# Patient Record
Sex: Male | Born: 1975 | Race: Black or African American | Hispanic: No | Marital: Single | Smoking: Former smoker
Health system: Southern US, Community
[De-identification: ages and names within clinical notes are randomized; demographics above are authoritative.]

## PROBLEM LIST (undated history)

## (undated) DIAGNOSIS — R0789 Other chest pain: Secondary | ICD-10-CM

## (undated) DIAGNOSIS — I639 Cerebral infarction, unspecified: Secondary | ICD-10-CM

## (undated) DIAGNOSIS — E785 Hyperlipidemia, unspecified: Secondary | ICD-10-CM

## (undated) DIAGNOSIS — I10 Essential (primary) hypertension: Secondary | ICD-10-CM

## (undated) DIAGNOSIS — T884XXA Failed or difficult intubation, initial encounter: Secondary | ICD-10-CM

## (undated) DIAGNOSIS — E119 Type 2 diabetes mellitus without complications: Secondary | ICD-10-CM

## (undated) HISTORY — DX: Essential (primary) hypertension: I10

## (undated) HISTORY — DX: Other chest pain: R07.89

## (undated) HISTORY — DX: Morbid (severe) obesity due to excess calories: E66.01

## (undated) HISTORY — PX: HAND SURGERY: SHX662

---

## 2014-02-17 DIAGNOSIS — I10 Essential (primary) hypertension: Secondary | ICD-10-CM | POA: Insufficient documentation

## 2014-02-17 DIAGNOSIS — Z79899 Other long term (current) drug therapy: Secondary | ICD-10-CM | POA: Insufficient documentation

## 2014-12-14 DIAGNOSIS — R739 Hyperglycemia, unspecified: Secondary | ICD-10-CM | POA: Insufficient documentation

## 2015-08-31 ENCOUNTER — Observation Stay
Admission: EM | Admit: 2015-08-31 | Discharge: 2015-09-01 | Disposition: A | Discharge status: Home / Self Care | Payer: Self-pay | Attending: Internal Medicine | Admitting: Internal Medicine

## 2015-08-31 ENCOUNTER — Emergency Department: Payer: Self-pay

## 2015-08-31 ENCOUNTER — Emergency Department: Payer: MEDICAID

## 2015-08-31 ENCOUNTER — Encounter: Payer: Self-pay | Admitting: Emergency Medicine

## 2015-08-31 DIAGNOSIS — Z794 Long term (current) use of insulin: Secondary | ICD-10-CM | POA: Insufficient documentation

## 2015-08-31 DIAGNOSIS — N179 Acute kidney failure, unspecified: Secondary | ICD-10-CM | POA: Diagnosis present

## 2015-08-31 DIAGNOSIS — E1165 Type 2 diabetes mellitus with hyperglycemia: Secondary | ICD-10-CM | POA: Insufficient documentation

## 2015-08-31 DIAGNOSIS — N183 Chronic kidney disease, stage 3 (moderate): Secondary | ICD-10-CM | POA: Insufficient documentation

## 2015-08-31 DIAGNOSIS — E781 Pure hyperglyceridemia: Secondary | ICD-10-CM | POA: Insufficient documentation

## 2015-08-31 DIAGNOSIS — R0789 Other chest pain: Secondary | ICD-10-CM

## 2015-08-31 DIAGNOSIS — R079 Chest pain, unspecified: Secondary | ICD-10-CM | POA: Insufficient documentation

## 2015-08-31 DIAGNOSIS — IMO0002 Reserved for concepts with insufficient information to code with codable children: Secondary | ICD-10-CM

## 2015-08-31 DIAGNOSIS — F17200 Nicotine dependence, unspecified, uncomplicated: Secondary | ICD-10-CM | POA: Insufficient documentation

## 2015-08-31 DIAGNOSIS — E1122 Type 2 diabetes mellitus with diabetic chronic kidney disease: Principal | ICD-10-CM | POA: Insufficient documentation

## 2015-08-31 DIAGNOSIS — I129 Hypertensive chronic kidney disease with stage 1 through stage 4 chronic kidney disease, or unspecified chronic kidney disease: Secondary | ICD-10-CM | POA: Insufficient documentation

## 2015-08-31 DIAGNOSIS — E785 Hyperlipidemia, unspecified: Secondary | ICD-10-CM

## 2015-08-31 DIAGNOSIS — Z833 Family history of diabetes mellitus: Secondary | ICD-10-CM | POA: Insufficient documentation

## 2015-08-31 DIAGNOSIS — Z8249 Family history of ischemic heart disease and other diseases of the circulatory system: Secondary | ICD-10-CM | POA: Insufficient documentation

## 2015-08-31 HISTORY — DX: Type 2 diabetes mellitus without complications: E11.9

## 2015-08-31 HISTORY — DX: Hyperlipidemia, unspecified: E78.5

## 2015-08-31 LAB — GLUCOSE, CAPILLARY
Glucose-Capillary: 344 mg/dL — ABNORMAL HIGH (ref 65–99)
Glucose-Capillary: 386 mg/dL — ABNORMAL HIGH (ref 65–99)

## 2015-08-31 LAB — CBC
HEMATOCRIT: 43.5 % (ref 40.0–52.0)
Hemoglobin: 14.5 g/dL (ref 13.0–18.0)
MCH: 28 pg (ref 26.0–34.0)
MCHC: 33.2 g/dL (ref 32.0–36.0)
MCV: 84.4 fL (ref 80.0–100.0)
PLATELETS: 196 10*3/uL (ref 150–440)
RBC: 5.16 MIL/uL (ref 4.40–5.90)
RDW: 15.2 % — ABNORMAL HIGH (ref 11.5–14.5)
WBC: 5.6 10*3/uL (ref 3.8–10.6)

## 2015-08-31 LAB — URINALYSIS COMPLETE WITH MICROSCOPIC (ARMC ONLY)
BACTERIA UA: NONE SEEN
BILIRUBIN URINE: NEGATIVE
Glucose, UA: >500 mg/dL — AB
Ketones, ur: NEGATIVE mg/dL
LEUKOCYTES UA: NEGATIVE
NITRITE: NEGATIVE
PH: 5 (ref 5.0–8.0)
Protein, ur: 100 mg/dL — AB
Specific Gravity, Urine: 1.016 (ref 1.005–1.030)
Squamous Epithelial / LPF: NONE SEEN

## 2015-08-31 LAB — BASIC METABOLIC PANEL
ANION GAP: 8 (ref 5–15)
BUN: 21 mg/dL — ABNORMAL HIGH (ref 6–20)
CHLORIDE: 100 mmol/L — AB (ref 101–111)
CO2: 23 mmol/L (ref 22–32)
Calcium: 8.7 mg/dL — ABNORMAL LOW (ref 8.9–10.3)
Creatinine, Ser: 1.74 mg/dL — ABNORMAL HIGH (ref 0.61–1.24)
GFR calc non Af Amer: 47 mL/min — ABNORMAL LOW (ref >60)
GFR, EST AFRICAN AMERICAN: 55 mL/min — AB (ref >60)
Glucose, Bld: 475 mg/dL — ABNORMAL HIGH (ref 65–99)
Potassium: 4.3 mmol/L (ref 3.5–5.1)
SODIUM: 131 mmol/L — AB (ref 135–145)

## 2015-08-31 LAB — CREATININE, SERUM
CREATININE: 1.58 mg/dL — AB (ref 0.61–1.24)
GFR calc Af Amer: >60 mL/min (ref >60)
GFR calc non Af Amer: 53 mL/min — ABNORMAL LOW (ref >60)

## 2015-08-31 LAB — TROPONIN I
Troponin I: <0.03 ng/mL (ref <0.031)
Troponin I: <0.03 ng/mL (ref <0.031)

## 2015-08-31 MED ORDER — HEPARIN SODIUM (PORCINE) 5000 UNIT/ML IJ SOLN
5000.0000 [IU] | Freq: Three times a day (TID) | INTRAMUSCULAR | Status: DC
  Administered 2015-09-01 (×3): 5000 [IU] via SUBCUTANEOUS
  Filled 2015-08-31 (×3): qty 1

## 2015-08-31 MED ORDER — ASPIRIN 81 MG PO CHEW
324.0000 mg | CHEWABLE_TABLET | Freq: Once | ORAL | Status: AC
  Administered 2015-08-31: 324 mg via ORAL
  Filled 2015-08-31: qty 4

## 2015-08-31 MED ORDER — ACETAMINOPHEN 325 MG PO TABS: 650.0000 mg | ORAL_TABLET | Freq: Four times a day (QID) | ORAL | Status: DC | PRN

## 2015-08-31 MED ORDER — MORPHINE SULFATE (PF) 2 MG/ML IV SOLN: 2.0000 mg | INTRAVENOUS | Status: DC | PRN

## 2015-08-31 MED ORDER — SODIUM CHLORIDE 0.9 % IV SOLN
INTRAVENOUS | Status: DC
  Administered 2015-09-01 (×3): via INTRAVENOUS

## 2015-08-31 MED ORDER — INSULIN ASPART 100 UNIT/ML ~~LOC~~ SOLN
0.0000 [IU] | Freq: Every day | SUBCUTANEOUS | Status: DC
  Administered 2015-09-01: 5 [IU] via SUBCUTANEOUS
  Filled 2015-08-31: qty 5

## 2015-08-31 MED ORDER — INSULIN REGULAR HUMAN 100 UNIT/ML IJ SOLN: 6.0000 [IU] | Freq: Once | INTRAMUSCULAR | Status: DC

## 2015-08-31 MED ORDER — ONDANSETRON HCL 4 MG/2ML IJ SOLN: 4.0000 mg | Freq: Four times a day (QID) | INTRAMUSCULAR | Status: DC | PRN

## 2015-08-31 MED ORDER — DOCUSATE SODIUM 100 MG PO CAPS
100.0000 mg | ORAL_CAPSULE | Freq: Two times a day (BID) | ORAL | Status: DC
  Administered 2015-09-01 (×2): 100 mg via ORAL
  Filled 2015-08-31 (×2): qty 1

## 2015-08-31 MED ORDER — INSULIN ASPART 100 UNIT/ML ~~LOC~~ SOLN
6.0000 [IU] | Freq: Once | SUBCUTANEOUS | Status: AC
  Administered 2015-08-31: 6 [IU] via INTRAVENOUS

## 2015-08-31 MED ORDER — INSULIN ASPART 100 UNIT/ML ~~LOC~~ SOLN
0.0000 [IU] | Freq: Three times a day (TID) | SUBCUTANEOUS | Status: DC
  Administered 2015-09-01: 5 [IU] via SUBCUTANEOUS
  Administered 2015-09-01: 9 [IU] via SUBCUTANEOUS
  Administered 2015-09-01: 5 [IU] via SUBCUTANEOUS
  Filled 2015-08-31 (×2): qty 5
  Filled 2015-08-31: qty 9

## 2015-08-31 MED ORDER — ACETAMINOPHEN 650 MG RE SUPP: 650.0000 mg | Freq: Four times a day (QID) | RECTAL | Status: DC | PRN

## 2015-08-31 MED ORDER — SODIUM CHLORIDE 0.9 % IV BOLUS (SEPSIS)
1000.0000 mL | Freq: Once | INTRAVENOUS | Status: AC
  Administered 2015-08-31: 1000 mL via INTRAVENOUS

## 2015-08-31 MED ORDER — INSULIN ASPART 100 UNIT/ML ~~LOC~~ SOLN
6.0000 [IU] | Freq: Once | SUBCUTANEOUS | Status: DC
  Filled 2015-08-31: qty 6

## 2015-08-31 MED ORDER — ASPIRIN EC 81 MG PO TBEC
81.0000 mg | DELAYED_RELEASE_TABLET | Freq: Every day | ORAL | Status: DC
  Administered 2015-09-01: 81 mg via ORAL
  Filled 2015-08-31: qty 1

## 2015-08-31 MED ORDER — ONDANSETRON HCL 4 MG PO TABS: 4.0000 mg | ORAL_TABLET | Freq: Four times a day (QID) | ORAL | Status: DC | PRN

## 2015-08-31 NOTE — ED Provider Notes (Signed)
Time Seen: Approximately 1730  I have reviewed the triage notes  Chief Complaint: Chest Pain   History of Present Illness: Micheal Robinson is a 40 y.o. male who presents with a 2 day history of intermittent chest discomfort. Patient points mainly just left of the sternal border and denies any radiation of his pain. Patient admits that he's been off his diabetic medication for the last month. The patient denies any associated symptoms such as nausea, vomiting, shortness of breath or diaphoresis. He denies any radiation to the arm, jaw or back region. He states pain is worse whenever he pushes with his arms and moves. He denies any obvious pulmonary emboli risk factors outside of smoking.   Past Medical History  Diagnosis Date  . Diabetes mellitus without complication (HCC)   . Hypertension     There are no active problems to display for this patient.   No past surgical history on file.  No past surgical history on file.  No current outpatient prescriptions on file.  Allergies:  Review of patient's allergies indicates no known allergies.  Family History: No family history on file.  Social History: Social History  Substance Use Topics  . Smoking status: Current Every Day Smoker  . Smokeless tobacco: None  . Alcohol Use: No     Review of Systems:   10 point review of systems was performed and was otherwise negative:  Constitutional: No fever Eyes: No visual disturbances ENT: No sore throat, ear pain Cardiac: Left-sided sharp chest discomfort. This particular episode is been constant since 3 PM Respiratory: No shortness of breath, wheezing, or stridor Abdomen: No abdominal pain, no vomiting, No diarrhea Endocrine: No weight loss, No night sweats Extremities: No peripheral edema, cyanosis Skin: No rashes, easy bruising Neurologic: No focal weakness, trouble with speech or swollowing Urologic: No dysuria, HematuriaPatient does describe urinary  frequency   Physical Exam:  ED Triage Vitals  Enc Vitals Group     BP 08/31/15 1629 138/80 mmHg     Pulse Rate 08/31/15 1629 90     Resp 08/31/15 1629 16     Temp 08/31/15 1629 98 F (36.7 C)     Temp Source 08/31/15 1629 Oral     SpO2 08/31/15 1629 98 %     Weight 08/31/15 1629 270 lb (122.471 kg)     Height 08/31/15 1629 5\' 6"  (1.676 m)     Head Cir --      Peak Flow --      Pain Score 08/31/15 1629 8     Pain Loc --      Pain Edu? --      Excl. in GC? --     General: Awake , Alert , and Oriented times 3; GCS 15 Head: Normal cephalic , atraumatic Eyes: Pupils equal , round, reactive to light Nose/Throat: No nasal drainage, patent upper airway without erythema or exudate.  Neck: Supple, Full range of motion, No anterior adenopathy or palpable thyroid masses Lungs: Clear to ascultation without wheezes , rhonchi, or rales Heart: Regular rate, regular rhythm without murmurs , gallops , or rubs Abdomen: Soft, non tender without rebound, guarding , or rigidity; bowel sounds positive and symmetric in all 4 quadrants. No organomegaly .        Extremities: 2 plus symmetric pulses. No edema, clubbing or cyanosis Neurologic: normal ambulation, Motor symmetric without deficits, sensory intact Skin: warm, dry, no rashes Reproducible pain with palpation of the left sternal border without crepitus or step-off  noted.  Labs:   All laboratory work was reviewed including any pertinent negatives or positives listed below:  Labs Reviewed  BASIC METABOLIC PANEL - Abnormal; Notable for the following:    Sodium 131 (*)    Chloride 100 (*)    Glucose, Bld 475 (*)    BUN 21 (*)    Creatinine, Ser 1.74 (*)    Calcium 8.7 (*)    GFR calc non Af Amer 47 (*)    GFR calc Af Amer 55 (*)    All other components within normal limits  CBC - Abnormal; Notable for the following:    RDW 15.2 (*)    All other components within normal limits  TROPONIN I  URINALYSIS COMPLETEWITH MICROSCOPIC ([facility]  ONLY)  TROPONIN I  CREATININE, SERUM    EKG: * ED ECG REPORT I, Jennye Moccasin, the attending physician, personally viewed and interpreted this ECG.  Date: 08/31/2015 EKG Time: *1631 Rate: *89 Rhythm: normal sinus rhythm QRS Axis: normal Intervals: normal ST/T Wave abnormalities: normal Conduction Disturbances: none Narrative Interpretation: unremarkable Old anterior septal infarct with poor R-wave progression noticed in the anterior leads. No acute ischemic changes  Radiology:   DG Chest 2 View (Final result) Result time: 08/31/15 17:37:49   Final result by Rad Results In Interface (08/31/15 17:37:49)   Narrative:   CLINICAL DATA: Pt reports centralized chest pain since yesterday, reports soreness. Pt reports DM and hasn't been taking his medication x1 month. Hx HTN. Current smoker  EXAM: CHEST 2 VIEW  COMPARISON: None.  FINDINGS: The heart size and mediastinal contours are within normal limits. Both lungs are clear. No pleural effusion or pneumothorax. The visualized skeletal structures are unremarkable.  IMPRESSION: No active cardiopulmonary disease.   Electronically Signed By: Amie Portland M.D. On: 08/31/2015 17:37           I personally reviewed the radiologic studies     ED Course: * Patient's stay here showed some improvement though he still had an elevated blood sugar and renal insufficiency. Serial troponins were negative. I felt given his current clinical presentation and objective findings he likely required further observation with IV fluids until his renal function cracks. It does not appear that he is in DKA at this point though very dehydrated.    Assessment: Acute unspecified chest pain Renal insufficiency Hyperglycemia Noncompliance     Plan:  Inpatient management            Jennye Moccasin, MD 08/31/15 2322

## 2015-08-31 NOTE — ED Notes (Signed)
Pt reports centralized chest pain since yesterday, reports soreness. Pt reports DM and hasn't been taking his medication x1 month. Pt alert and oriented upon arrival, unable to obtain EKG in 10 minutes due to pt needing to use restroom.

## 2015-08-31 NOTE — ED Notes (Signed)
Pt transported to room 233. 

## 2015-09-01 ENCOUNTER — Encounter: Payer: Self-pay | Admitting: Internal Medicine

## 2015-09-01 ENCOUNTER — Telehealth: Payer: Self-pay | Admitting: Cardiology

## 2015-09-01 ENCOUNTER — Observation Stay (HOSPITAL_BASED_OUTPATIENT_CLINIC_OR_DEPARTMENT_OTHER)
Admit: 2015-09-01 | Discharge: 2015-09-01 | Disposition: A | Discharge status: Home / Self Care | Payer: MEDICAID | Attending: Student | Admitting: Student

## 2015-09-01 DIAGNOSIS — E1165 Type 2 diabetes mellitus with hyperglycemia: Secondary | ICD-10-CM

## 2015-09-01 DIAGNOSIS — R0789 Other chest pain: Secondary | ICD-10-CM

## 2015-09-01 DIAGNOSIS — R079 Chest pain, unspecified: Secondary | ICD-10-CM

## 2015-09-01 DIAGNOSIS — E785 Hyperlipidemia, unspecified: Secondary | ICD-10-CM

## 2015-09-01 DIAGNOSIS — R9431 Abnormal electrocardiogram [ECG] [EKG]: Secondary | ICD-10-CM

## 2015-09-01 DIAGNOSIS — IMO0002 Reserved for concepts with insufficient information to code with codable children: Secondary | ICD-10-CM

## 2015-09-01 DIAGNOSIS — E1122 Type 2 diabetes mellitus with diabetic chronic kidney disease: Principal | ICD-10-CM

## 2015-09-01 DIAGNOSIS — N182 Chronic kidney disease, stage 2 (mild): Secondary | ICD-10-CM

## 2015-09-01 DIAGNOSIS — N179 Acute kidney failure, unspecified: Secondary | ICD-10-CM

## 2015-09-01 LAB — TROPONIN I
Troponin I: <0.03 ng/mL (ref <0.031)
Troponin I: <0.03 ng/mL (ref <0.031)
Troponin I: <0.03 ng/mL (ref <0.031)

## 2015-09-01 LAB — LIPID PANEL
Cholesterol: 291 mg/dL — ABNORMAL HIGH (ref 0–200)
HDL: 39 mg/dL — ABNORMAL LOW (ref >40)
LDL Cholesterol: UNDETERMINED mg/dL (ref 0–99)
Total CHOL/HDL Ratio: 7.5 RATIO
Triglycerides: 982 mg/dL — ABNORMAL HIGH (ref <150)
VLDL: UNDETERMINED mg/dL (ref 0–40)

## 2015-09-01 LAB — TSH: TSH: 2.253 u[IU]/mL (ref 0.350–4.500)

## 2015-09-01 LAB — HEMOGLOBIN A1C: Hgb A1c MFr Bld: 13.4 % — ABNORMAL HIGH (ref 4.0–6.0)

## 2015-09-01 LAB — GLUCOSE, CAPILLARY
Glucose-Capillary: 289 mg/dL — ABNORMAL HIGH (ref 65–99)
Glucose-Capillary: 257 mg/dL — ABNORMAL HIGH (ref 65–99)
Glucose-Capillary: 395 mg/dL — ABNORMAL HIGH (ref 65–99)

## 2015-09-01 LAB — ECHOCARDIOGRAM COMPLETE
Height: 66 in
Weight: 4576 oz

## 2015-09-01 MED ORDER — INSULIN NPH ISOPHANE & REGULAR (70-30) 100 UNIT/ML ~~LOC~~ SUSP: 18.0000 [IU] | Freq: Two times a day (BID) | SUBCUTANEOUS | Status: DC

## 2015-09-01 MED ORDER — ASPIRIN 81 MG PO TBEC: 81.0000 mg | DELAYED_RELEASE_TABLET | Freq: Every day | ORAL | Status: DC

## 2015-09-01 MED ORDER — INSULIN STARTER KIT- SYRINGES (ENGLISH)
1.0000 | Freq: Once | Status: AC
  Administered 2015-09-01: 1
  Filled 2015-09-01: qty 1

## 2015-09-01 MED ORDER — LOVASTATIN 40 MG PO TABS: 40.0000 mg | ORAL_TABLET | Freq: Every day | ORAL | Status: AC

## 2015-09-01 MED ORDER — LIVING WELL WITH DIABETES BOOK
Freq: Once | Status: AC
  Administered 2015-09-01: 11:00:00
  Filled 2015-09-01: qty 1

## 2015-09-01 MED ORDER — INSULIN GLARGINE 100 UNIT/ML ~~LOC~~ SOLN
30.0000 [IU] | Freq: Every day | SUBCUTANEOUS | Status: DC
  Filled 2015-09-01: qty 0.3

## 2015-09-01 MED ORDER — INSULIN GLARGINE 100 UNIT/ML ~~LOC~~ SOLN
25.0000 [IU] | Freq: Every day | SUBCUTANEOUS | Status: DC
  Administered 2015-09-01: 25 [IU] via SUBCUTANEOUS
  Filled 2015-09-01 (×2): qty 0.25

## 2015-09-01 MED ORDER — PRAVASTATIN SODIUM 20 MG PO TABS
20.0000 mg | ORAL_TABLET | Freq: Every day | ORAL | Status: DC
  Administered 2015-09-01: 20 mg via ORAL
  Filled 2015-09-01: qty 1

## 2015-09-01 MED ORDER — PANTOPRAZOLE SODIUM 40 MG PO TBEC: 40.0000 mg | DELAYED_RELEASE_TABLET | Freq: Every day | ORAL | Status: DC

## 2015-09-01 MED ORDER — ATORVASTATIN CALCIUM 20 MG PO TABS: 80.0000 mg | ORAL_TABLET | Freq: Every day | ORAL | Status: DC

## 2015-09-01 NOTE — Consult Note (Signed)
Cardiology Consult    Patient ID: Micheal Robinson MRN: [id number], DOB/AGE: 12/09/1975   Admit date: 08/31/2015 Date of Consult: 09/01/2015  Primary Physician: No primary care provider on file. Reason for Consult: Chest Pain; Abnormal EKG Primary Cardiologist: New  Requesting Provider: Dr. Sheryle Hailiamond   History of Present Illness    Micheal HohLeonard R Lawlor is a 40 y.o. male with past medical history of HTN and Type 2 DM who [facility] for evaluation of chest pain.   Patient reports he developed a shooting pain radiating across his chest yesterday afternoon. The pain lasted for about 30 minutes then resolved spontaneously. Says his chest was tender to touch and remains tender to touch at this time. Says it feels sore, like he bench pressed too much weight (did not lift weights yesterday, but does this regularly). Was diaphoretic but denies any associated dyspnea, nausea, vomiting or radiating pain. No association with exertion.   Denies any history of chest discomfort in the past. No chest discomfort or dyspnea with exertion. Denies orthopnea, PND or lower extremity edema.   No family history of MI's or CAD. Says his mother and father both have Type 2 DM and HTN. Says he smokes 1-2 cigars weekly.   He has been diabetic for 10+ years but quit taking his diabetic and hypertensive medications 3-4 months ago due to running out of them. Was on Metformin and Lisinopril at that time.  While admitted, cyclic troponin values have been negative. CBC with WBC of 5.6, Hgb 14.5, and platelets 196. BMET with glucose of 475 and creatinine of 1.74 (unknown baseline).  A1c elevated to 13.4. Lipid Panel with Total Cholesterol of 291, Triglycerides 982, and HDL 39. LDL unable to be calculated due to calculated. CXR shows no active cardiopulmonary disease. EKG shows NSR, HR 89, nonspecific ST abnormality in Lead III, and poor R-wave progression. No prior tracings available for  comparison.   Past Medical History   Past Medical History  Diagnosis Date  . Diabetes mellitus without complication (HCC)   . Hypertension     Past Surgical History  Procedure Laterality Date  . Hand surgery       Allergies  No Known Allergies  Inpatient Medications    . aspirin EC  81 mg Oral Daily  . docusate sodium  100 mg Oral BID  . heparin  5,000 Units Subcutaneous Q8H  . insulin aspart  0-5 Units Subcutaneous QHS  . insulin aspart  0-9 Units Subcutaneous TID WC    Family History    Family History  Problem Relation Age of Onset  . CAD Father   . Diabetes Mellitus II Maternal Grandmother     Social History    Social History   Social History  . Marital Status: Married    Spouse Name: N/A  . Number of Children: N/A  . Years of Education: N/A   Occupational History  . Not on file.   Social History Main Topics  . Smoking status: Current Every Day Smoker  . Smokeless tobacco: Not on file  . Alcohol Use: No  . Drug Use: Not on file  . Sexual Activity: Not on file   Other Topics Concern  . Not on file   Social History Narrative     Review of Systems    General:  No chills, fever, night sweats or weight changes.  Cardiovascular:  No dyspnea on exertion, edema, orthopnea, palpitations, paroxysmal nocturnal dyspnea. Positive for chest pain.  Dermatological: No rash, lesions/masses Respiratory: No cough, dyspnea Urologic: No hematuria, dysuria Abdominal:   No nausea, vomiting, diarrhea, bright red blood per rectum, melena, or hematemesis Neurologic:  No visual changes, wkns, changes in mental status. All other systems reviewed and are otherwise negative except as noted above.  Physical Exam    Blood pressure 151/98, pulse 83, temperature 98.6 F (37 C), temperature source Oral, resp. rate 18, height  (1.676 m), weight 286 lb (129.729 kg), SpO2 94 %.  General: Pleasant, obese African American male appearing in NAD Psych: Normal  affect. Neuro: Alert and oriented X 3. Moves all extremities spontaneously. HEENT: Normal  Neck: Supple without bruits or JVD. Lungs:  Resp regular and unlabored, CTA without wheezing or rales. Heart: RRR no s3, s4, or murmurs. Tender to palpation along precordium.  Abdomen: Soft, non-tender, non-distended, BS + x 4.  Extremities: No clubbing, cyanosis or edema. DP/PT/Radials 2+ and equal bilaterally.  Labs    Troponin (Point of Care Test) No results for input(s): TROPIPOC in the last 72 hours.  Recent Labs  08/31/15 1638 08/31/15 2006 08/31/15 2327 09/01/15 0543  TROPONINI <0.03 <0.03 <0.03 <0.03   Lab Results  Component Value Date   WBC 5.6 08/31/2015   HGB 14.5 08/31/2015   HCT 43.5 08/31/2015   MCV 84.4 08/31/2015   PLT 196 08/31/2015    Recent Labs Lab 08/31/15 1638 08/31/15 2006  NA 131*  --   K 4.3  --   CL 100*  --   CO2 23  --   BUN 21*  --   CREATININE 1.74* 1.58*  CALCIUM 8.7*  --   GLUCOSE 475*  --    Lab Results  Component Value Date   CHOL 291* 09/01/2015   HDL 39* 09/01/2015   LDLCALC UNABLE TO CALCULATE IF TRIGLYCERIDE OVER 400 mg/dL 16/12/9602   TRIG 540* 09/01/2015   No results found for: [facility]   Radiology Studies    Dg Chest 2 View: 08/31/2015  CLINICAL DATA:  Pt reports centralized chest pain since yesterday, reports soreness. Pt reports DM and hasn't been taking his medication x1 month. Hx HTN. Current smoker EXAM: CHEST  2 VIEW COMPARISON:  None. FINDINGS: The heart size and mediastinal contours are within normal limits. Both lungs are clear. No pleural effusion or pneumothorax. The visualized skeletal structures are unremarkable. IMPRESSION: No active cardiopulmonary disease. Electronically Signed   By: Amie Portland M.D.   On: 08/31/2015 17:37    EKG & Cardiac Imaging    EKG: NSR, HR 89, nonspecific ST abnormality in Lead III, and poor R-wave progression (No prior tracings available for comparison.)  Echocardiogram: None on  file  Assessment & Plan    1. Atypical Chest Pain -  developed a shooting pain radiating across his chest yesterday afternoon, lasting for 30 minutes then resolving  spontaneously. Pain is reproducible with palpation. No exertional component noted. - cyclic troponin values have been negative. EKG shows nonspecific ST abnormality in Lead III and poor R-wave progression, unfortunately no prior tracings are available for comparison. - While his current presentation of chest pain sounds atypical for a cardiac etiology, he does have several cardiac risk factors including uncontrolled Type 2 DM, HTN, obesity, and current smoker (1-2 cigars weekly). - will obtain an echocardiogram for initial assessment of LV function and wall motion. If significantly abnormal, would require further ischemic evaluation while inpatient. Otherwise, if LV function normal, could consider an outpatient Treadmill Myoview. - continue ASA and statin.  2. HTN - BP has been 121/67 - 151/98 in the past 24 hours. - would restart Lisinopril once creatinine normalizes.   3. HLD - Lipid Panel with Total Cholesterol of 291, Triglycerides 982, and HDL 39. LDL unable to be calculated due to calculated.  - will start high-intensity statin. Check LFT's.   4. Uncontrolled Type 2 DM - A1c elevated to 13.4 this admission. - was on Metformin PTA but had not taken in 4+ months. - per admitting team.  5. AKI - unknown baseline. Creatinine elavted to 1.74 on admission, improved to 1.58 on most recent check.  - receiving IVF.  Signed, Ellsworth Lennox, PA-C 09/01/2015, 7:41 AM Pager: [phone]

## 2015-09-01 NOTE — Telephone Encounter (Signed)
TCM ph [facility] for chest pain saw harding needs 1 week fu from dc 09/01/15 Scheduled next available on 09/23/15 with Thayer Ohmhris Added to waitlist

## 2015-09-01 NOTE — Discharge Instructions (Addendum)
°  DIET:  Diabetic diet  DISCHARGE CONDITION:  Stable  ACTIVITY:  Activity as tolerated  OXYGEN:  Home Oxygen: No.   Oxygen Delivery: room air  DISCHARGE LOCATION:  home   If you experience worsening of your admission symptoms, develop shortness of breath, life threatening emergency, suicidal or homicidal thoughts you must seek medical attention immediately by calling 911 or calling your MD immediately  if symptoms less severe.  You Must read complete instructions/literature along with all the possible adverse reactions/side effects for all the Medicines you take and that have been prescribed to you. Take any new Medicines after you have completely understood and accpet all the possible adverse reactions/side effects.   Please note  You were cared for by a hospitalist during your hospital stay. If you have any questions about your discharge medications or the care you received while you were in the hospital after you are discharged, you can call the unit and asked to speak with the hospitalist on call if the hospitalist that took care of you is not available. Once you are discharged, your primary care physician will handle any further medical issues. Please note that NO REFILLS for any discharge medications will be authorized once you are discharged, as it is imperative that you return to your primary care physician (or establish a relationship with a primary care physician if you do not have one) for your aftercare needs so that they can reassess your need for medications and monitor your lab values.   Check blood sugars 3 times a day before meals

## 2015-09-01 NOTE — H&P (Signed)
Micheal Robinson is an 40 y.o. male.   Chief Complaint: Chest pain HPI: The patient with past medical history of diabetes and hypertension presents emergency department complaining of chest pain. The patient points to his upper chest just below his throat as the source of his pain. He states that it began hurting this afternoon while he was at work. He mentions that he's been under a lot of stress lately and that he also has run out of his medications. He denies shortness of breath, nausea, vomiting or diaphoresis. In the emergency department the patient was found to have poor R-wave progression in his precordial leads. Laboratory evaluation also revealed significant hyperglycemia as well as acute kidney injury which prompted the emergency department staff to call the hospitalist service for admission.  Past Medical History  Diagnosis Date  . Diabetes mellitus without complication ([place])   . Hypertension     Past Surgical History  Procedure Laterality Date  . Hand surgery      Family History  Problem Relation Age of Onset  . CAD Father   . Diabetes Mellitus II Maternal Grandmother    Social History:  reports that he has been smoking.  He does not have any smokeless tobacco history on file. He reports that he does not drink alcohol. His drug history is not on file.  Allergies: No Known Allergies  No prescriptions prior to admission  Metformin? Amlodipine? (Patient is not sure)  Results for orders placed or performed during the hospital encounter of 08/31/15 (from the past 48 hour(s))  Basic metabolic panel     Status: Abnormal   Collection Time: 08/31/15  4:38 PM  Result Value Ref Range   Sodium 131 (L) 135 - 145 mmol/L   Potassium 4.3 3.5 - 5.1 mmol/L   Chloride 100 (L) 101 - 111 mmol/L   CO2 23 22 - 32 mmol/L   Glucose, Bld 475 (H) 65 - 99 mg/dL   BUN 21 (H) 6 - 20 mg/dL   Creatinine, Ser 1.74 (H) 0.61 - 1.24 mg/dL   Calcium 8.7 (L) 8.9 - 10.3 mg/dL   GFR calc non Af Amer 47  (L) >60 mL/min   GFR calc Af Amer 55 (L) >60 mL/min    Comment: (NOTE) The eGFR has been calculated using the CKD EPI equation. This calculation has not been validated in all clinical situations. eGFR's persistently <60 mL/min signify possible Chronic Kidney Disease.    Anion gap 8 5 - 15  CBC     Status: Abnormal   Collection Time: 08/31/15  4:38 PM  Result Value Ref Range   WBC 5.6 3.8 - 10.6 K/uL   RBC 5.16 4.40 - 5.90 MIL/uL   Hemoglobin 14.5 13.0 - 18.0 g/dL   HCT 43.5 40.0 - 52.0 %   MCV 84.4 80.0 - 100.0 fL   MCH 28.0 26.0 - 34.0 pg   MCHC 33.2 32.0 - 36.0 g/dL   RDW 15.2 (H) 11.5 - 14.5 %   Platelets 196 150 - 440 K/uL  Troponin I     Status: None   Collection Time: 08/31/15  4:38 PM  Result Value Ref Range   Troponin I <0.03 <0.031 ng/mL    Comment:        NO INDICATION OF MYOCARDIAL INJURY.   Urinalysis complete, with microscopic ([facility] only)     Status: Abnormal   Collection Time: 08/31/15  5:58 PM  Result Value Ref Range   Color, Urine STRAW (A) YELLOW  APPearance CLEAR (A) CLEAR   Glucose, UA >500 (A) NEGATIVE mg/dL   Bilirubin Urine NEGATIVE NEGATIVE   Ketones, ur NEGATIVE NEGATIVE mg/dL   Specific Gravity, Urine 1.016 1.005 - 1.030   Hgb urine dipstick 1+ (A) NEGATIVE   pH 5.0 5.0 - 8.0   Protein, ur 100 (A) NEGATIVE mg/dL   Nitrite NEGATIVE NEGATIVE   Leukocytes, UA NEGATIVE NEGATIVE   RBC / HPF 0-5 0 - 5 RBC/hpf   WBC, UA 0-5 0 - 5 WBC/hpf   Bacteria, UA NONE SEEN NONE SEEN   Squamous Epithelial / LPF NONE SEEN NONE SEEN  Troponin I     Status: None   Collection Time: 08/31/15  8:06 PM  Result Value Ref Range   Troponin I <0.03 <0.031 ng/mL    Comment:        NO INDICATION OF MYOCARDIAL INJURY.   Creatinine, serum     Status: Abnormal   Collection Time: 08/31/15  8:06 PM  Result Value Ref Range   Creatinine, Ser 1.58 (H) 0.61 - 1.24 mg/dL   GFR calc non Af Amer 53 (L) >60 mL/min   GFR calc Af Amer >60 >60 mL/min    Comment: (NOTE) The  eGFR has been calculated using the CKD EPI equation. This calculation has not been validated in all clinical situations. eGFR's persistently <60 mL/min signify possible Chronic Kidney Disease.   Glucose, capillary     Status: Abnormal   Collection Time: 08/31/15  9:19 PM  Result Value Ref Range   Glucose-Capillary 344 (H) 65 - 99 mg/dL  TSH     Status: None   Collection Time: 08/31/15 11:27 PM  Result Value Ref Range   TSH 2.253 0.350 - 4.500 uIU/mL  Troponin I     Status: None   Collection Time: 08/31/15 11:27 PM  Result Value Ref Range   Troponin I <0.03 <0.031 ng/mL    Comment:        NO INDICATION OF MYOCARDIAL INJURY.   Glucose, capillary     Status: Abnormal   Collection Time: 08/31/15 11:30 PM  Result Value Ref Range   Glucose-Capillary 386 (H) 65 - 99 mg/dL   Dg Chest 2 View  08/31/2015  CLINICAL DATA:  Pt reports centralized chest pain since yesterday, reports soreness. Pt reports DM and hasn't been taking his medication x1 month. Hx HTN. Current smoker EXAM: CHEST  2 VIEW COMPARISON:  None. FINDINGS: The heart size and mediastinal contours are within normal limits. Both lungs are clear. No pleural effusion or pneumothorax. The visualized skeletal structures are unremarkable. IMPRESSION: No active cardiopulmonary disease. Electronically Signed   By: Lajean Manes M.D.   On: 08/31/2015 17:37    Review of Systems  Constitutional: Negative for fever and chills.  HENT: Negative for sore throat and tinnitus.   Eyes: Positive for blurred vision. Negative for redness.  Respiratory: Negative for cough and shortness of breath.   Cardiovascular: Positive for chest pain. Negative for palpitations, orthopnea and PND.  Gastrointestinal: Negative for nausea, vomiting, abdominal pain and diarrhea.  Genitourinary: Positive for frequency. Negative for dysuria and urgency.  Musculoskeletal: Negative for myalgias and joint pain.  Skin: Negative for rash.       No lesions  Neurological:  Negative for speech change, focal weakness and weakness.  Endo/Heme/Allergies: Does not bruise/bleed easily.       No temperature intolerance  Psychiatric/Behavioral: Negative for depression and suicidal ideas.    Blood pressure 146/94, pulse 85, temperature 98.8  F (37.1 C), temperature source Oral, resp. rate 18, height '5\' 6"'$  (1.676 m), weight 129.729 kg (286 lb), SpO2 99 %. Physical Exam  Vitals reviewed. Constitutional: He is oriented to person, place, and time. He appears well-developed and well-nourished. No distress.  HENT:  Head: Normocephalic and atraumatic.  Mouth/Throat: Oropharynx is clear and moist.  Eyes: Conjunctivae and EOM are normal. Pupils are equal, round, and reactive to light. No scleral icterus.  Neck: Normal range of motion. Neck supple. No JVD present. No tracheal deviation present. No thyromegaly present.  Cardiovascular: Normal rate and regular rhythm.  Exam reveals no gallop and no friction rub.   No murmur heard. Respiratory: Effort normal and breath sounds normal. No respiratory distress. He exhibits tenderness (reproducible).  GI: Soft. Bowel sounds are normal. He exhibits no distension. There is no tenderness.  Genitourinary:  Deferred  Musculoskeletal: Normal range of motion. He exhibits no edema.  Lymphadenopathy:    He has no cervical adenopathy.  Neurological: He is alert and oriented to person, place, and time. No cranial nerve deficit.  Skin: Skin is warm and dry. No rash noted. No erythema.  Psychiatric: He has a normal mood and affect. His behavior is normal. Judgment and thought content normal.     Assessment/Plan This is a 40 year old male admitted for chest pain and hyperglycemia. 1. Chest pain: Reproducible and unlikely cardiac in origin. However it is clear the patient has had some strain to his heart muscle and/or silent heart attack in the past as he has downgoing R waves in his precordial leads. Troponin has been negative so far.  Continue to monitor telemetry. 2. IVs mellitus type II: With hyperglycemia. The patient cannot remember specifically which oral hypoglycemic was taking. While hospitalized I will start sliding scale insulin. He mentions that in the morning he frequently has blurry vision and that he has to urinate very frequently throughout the night. He will likely benefit from Lantus at night. 3. Essential hypertension: Controlled for now. If his acute kidney injury resolves he'll benefit from a low-dose ACE inhibitor. 4. Acute kidney injury: Aggressively hydrate with intravenous saline. 5. DVT prophylaxis: Heparin 6. GI prophylaxis: None The patient is a full code. Time spent on admission orders and patient care approximately 45 minutes  Harrie Foreman, MD 09/01/2015, 1:51 AM

## 2015-09-01 NOTE — Care Management (Signed)
Glucometer given to patient. Faxed medication management application to clinic for patient.

## 2015-09-01 NOTE — Progress Notes (Signed)
Spoke with patient and his wife about diabetes and home regimen for diabetes control. Patient reports that he does not have a PCP and has not taken any medications for diabetes in 3-4 months when he ran out of medications. Patient states that he was taking oral medication for diabetes in the past. Patient has been having symptoms of hyperglycemia for a couple of months. Patient has not checked his glucose at home because he lost his glucometer when he last moved. Discussed A1C results (13.4% on 08/31/15) and explained that his current A1C indicates an average glucose of 338 mg/dl over the past 2-3 months. Discussed glucose and A1C goals. Discussed hyperglycemia and hypoglycemia along with treatment for both. Explained to the patient that his glucose will not be perfect before he is discharged and explained that he will need to take insulin as prescribed and keep a record which the doctor he follows up with will use to make further adjustments with his insulin. Discussed importance of checking CBGs and maintaining good CBG control to prevent long-term and short-term complications.  Stressed to the patient the importance of improving glycemic control to prevent further complications from uncontrolled diabetes. Discussed impact of nutrition, exercise, stress, sickness, and medications on diabetes control.Patient reports that he does not have any insurance and plans to get established with [facility] and will [facility] to get discharge medications.  Discussed Lantus and Novolog insulin which patient is receiving as an inpatient. Patient inquired about just taking a pill for diabetes; but explained that with his A1C being 13.4% he is going to need insulin. However, informed patient that the doctor may also use oral DM medications in addition to the insulin if appropriate. Explained that Lantus is about $200 per vial and Novolog is about $150 per vial. Explained to the patient that diabetes  coordinator will make a recommendation to discharge him on a more affordable insulin such as NOVOLIN 70/30. Informed patient that Novolin 70/30 [facility] $25 per vial. Provided patient with handout information on Reli-On products and encouraged patient to go to [facility] to get the Reli-On Prime glucometer for $9 and a box of 50 Reli-On test strips for $9. Discussed 70/30 insulin in detail (how to take it, when to take it). Asked patient to check his glucose 3-4 times per day (before meals and at bedtime) and to keep a log book of glucose readings and insulin taken. Explained how the doctor he follows up with can use the log book to continue to make insulin adjustments if needed. Reviewed and demonstrated how to draw up and administer insulin with vial and syringe. Patient was able to successfully demonstrate how to draw up and administer insulin with vial and syringe. Informed patient that RN will be asking him to self-administer insulin to ensure proper technique and ability to administer self insulin shots.   Encouraged patient to go ahead and call the [facility] to get an appointment and to ask if they have any glucometers for patients. Also encouraged patient to go ahead and fill out application for [facility]. Patient verbalized understanding of information discussed and he states that he has no further questions at this time related to diabetes.   RNs to provide ongoing basic DM education at bedside with this patient and engage patient to actively check blood glucose and administer insulin injections.   Patient was given Lantus 25 units this morning at 9:37 am today and glucose  257 at 11:25 am. Anticipate patient will require more basal insulin than currently ordered and would benefit from meal coverage insulin as well. Recommend discharging patient on 70/30 23 units BID with meals (based on 129 kg x 0.25 units; which will provide a total of 32 units for basal  insulin and 14 units for meal coverage per day).   MD - At time of discharge, please provide patient with Rx for NOVOLIN 70/30.   Thanks, Orlando Penner, RN, MSN, CDE Diabetes Coordinator Inpatient Diabetes Program [phone] (Team Pager from 8am to 5pm) [phone] ([facility] office) [phone] [facility] office) [phone] [facility] office)

## 2015-09-01 NOTE — Care Management (Signed)
Attempted to see patient. He was resting with his eyes closed and would not wake to his name being called. Will reassess.

## 2015-09-01 NOTE — Progress Notes (Signed)
Patient is discharge home in a stable condition, summary and f/u care given ,verbalized understanding , left with wife  

## 2015-09-01 NOTE — Progress Notes (Signed)
[facility]         [place], [place]   09/01/2015  Patient: Micheal Robinson   Date of Birth:  02/21/1976  Date of admission:  08/31/2015  Date of Discharge  09/01/2015    To Whom it May Concern:   Micheal Robinson  may return to work on 09/02/2015.  If you have any questions or concerns, please don't hesitate to call.  Sincerely,   [name], Izekiel Flegel R M.D Office : [phone]   .

## 2015-09-01 NOTE — Care Management (Signed)
Attempted assessment again. Patient had visitors at bedside. He ask that I come back at another. Will reassess.

## 2015-09-01 NOTE — Care Management (Addendum)
Met with patient at bedside to discuss discharge planning. Patient lives at home with his girlfriend. He is independent, active and works 2 jobs. Denies issues accessing transportation. Uninsured. [facility]. Email sent to [facility] for follow up. New diabetic.Marland KitchenMarland KitchenPatient denies issues getting a glucometer and paying for insulin if needed. Explained to him that Novolin at [facility] was 24.88/bottle. Diabetic Coordinator to see patient prior to DC.

## 2015-09-01 NOTE — Progress Notes (Signed)
*  PRELIMINARY RESULTS* Echocardiogram 2D Echocardiogram has been performed.  Micheal HousekeeperJerry R Robinson 09/01/2015, 10:34 AM

## 2015-09-01 NOTE — Telephone Encounter (Signed)
Patient [facility] 14.  Patient understands to follow up with provider Ward Givenshris Berge on 09/23/15 at 11:00 [facility]. Patient understands discharge instructions? yes Patient understands medications and regiment? yes Patient understands to bring all medications to this visit? yes

## 2015-09-04 NOTE — Discharge Summary (Signed)
[facility] [place] [facility]   PATIENT NAME: Micheal Robinson    MR#:  [id number]  DATE OF BIRTH:  Aug 22, 1975  DATE OF ADMISSION:  08/31/2015 ADMITTING PHYSICIAN: Arnaldo Natal, MD  DATE OF DISCHARGE: 09/01/2015  5:35 PM  PRIMARY CARE PHYSICIAN: No primary care provider on file.   ADMISSION DIAGNOSIS:  Chest pain, unspecified chest pain type [R07.9]  DISCHARGE DIAGNOSIS:  Active Problems:   AKI (acute kidney injury) (HCC)   Atypical chest pain   Dyslipidemia   Uncontrolled type 2 diabetes mellitus (HCC)   Pain in the chest   SECONDARY DIAGNOSIS:   Past Medical History  Diagnosis Date  . Diabetes mellitus without complication (HCC)     a. 08/2015: uncontrolled, A1c 13.4  . Hypertension   . Hyperlipidemia      ADMITTING HISTORY  Micheal Robinson is an 40 y.o. Male.  Chief Complaint: Chest pain  HPI: The patient with past medical history of diabetes and hypertension presents emergency department complaining of chest pain. The patient points to his upper chest just below his throat as the source of his pain. He states that it began hurting this afternoon while he was at work. He mentions that he's been under a lot of stress lately and that he also has run out of his medications. He denies shortness of breath, nausea, vomiting or diaphoresis. In the emergency department the patient was found to have poor R-wave progression in his precordial leads. Laboratory evaluation also revealed significant hyperglycemia as well as acute kidney injury which prompted the emergency department staff to call the hospitalist service for admission.  HOSPITAL COURSE:   * Patient had some neck pain with nonspecific EKG changes. Upon inject 3 times was normal. Echocardiogram showed normal ejection fraction without wall motion abnormalities. Cardiology has seen the patient and advised outpatient follow-up for a routine stress test. His pain was thought to be  likely symptoms of GERD. Have resolved.  * Uncontrolled diabetes mellitus Patient was on metformin in the past but has not taken it for 4 months. His HbA1c is significantly elevated at 13.7. Started on insulin 70/30 mix. Blood sugars improved.  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 Follow-up as outpatient.  * Hypertriglyceridemia Due to uncontrolled diabetes. Started on statin and gemfibrozil. Repeat labs of lipid panel, CK and LFTs in 4 weeks with PCP.   Discharged home in stable condition to follow-up with his PCP and cardiology.  CONSULTS OBTAINED:  Treatment Team:  Antonieta Iba, MD  DRUG ALLERGIES:  No Known Allergies  DISCHARGE MEDICATIONS:   Discharge Medication List as of 09/01/2015  4:02 PM    START taking these medications   Details  aspirin EC 81 MG EC tablet Take 1 tablet (81 mg total) by mouth daily., Starting 09/01/2015, Until Discontinued, OTC    insulin NPH-regular Human (NOVOLIN 70/30) (70-30) 100 UNIT/ML injection Inject 18 Units into the skin 2 (two) times daily with a meal., Starting 09/01/2015, Until Discontinued, Normal    lovastatin (MEVACOR) 40 MG tablet Take 1 tablet (40 mg total) by mouth at bedtime., Starting 09/01/2015, Until Discontinued, Normal    pantoprazole (PROTONIX) 40 MG tablet Take 1 tablet (40 mg total) by mouth daily., Starting 09/01/2015, Until Discontinued, Normal        Today   VITAL SIGNS:  Blood pressure 130/65, pulse 77, temperature 98 F (36.7 C), temperature source Oral, resp. rate 20, height  (1.676 m), weight 129.729 kg (  286 lb), SpO2 95 %.  I/O:  No intake or output data in the 24 hours ending 09/04/15 1231  PHYSICAL EXAMINATION:  Physical Exam  GENERAL:  40 y.o.-year-old patient lying in the bed with no acute distress. Morbidly obese LUNGS: Normal breath sounds bilaterally, no wheezing, rales,rhonchi or crepitation. No use of accessory muscles of respiration.  CARDIOVASCULAR: S1, S2 normal. No  murmurs, rubs, or gallops.  ABDOMEN: Soft, non-tender, non-distended. Bowel sounds present. No organomegaly or mass.  NEUROLOGIC: Moves all 4 extremities. PSYCHIATRIC: The patient is alert and oriented x 3.  SKIN: No obvious rash, lesion, or ulcer.   DATA REVIEW:   CBC  Recent Labs Lab 08/31/15 1638  WBC 5.6  HGB 14.5  HCT 43.5  PLT 196    Chemistries   Recent Labs Lab 08/31/15 1638 08/31/15 2006  NA 131*  --   K 4.3  --   CL 100*  --   CO2 23  --   GLUCOSE 475*  --   BUN 21*  --   CREATININE 1.74* 1.58*  CALCIUM 8.7*  --     Cardiac Enzymes  Recent Labs Lab 09/01/15 1116  TROPONINI <0.03    Microbiology Results  No results found for this or any previous visit.  RADIOLOGY:  No results found.  Follow up with PCP in 1 week.  Management plans discussed with the patient, family and they are in agreement.  CODE STATUS:  Code Status History    Date Active Date Inactive Code Status Order ID Comments User Context   08/31/2015 11:23 PM 09/01/2015  8:35 PM Full Code [id number]  Arnaldo NatalMichael S Diamond, MD Inpatient      TOTAL TIME TAKING CARE OF THIS PATIENT ON DAY OF DISCHARGE: more than 30 minutes.   [name], Madden Piazza R M.D on 09/04/2015 at 12:31 PM  Between 7am to 6pm - Pager - [phone]  After 6pm go to [email or web address] - password [email or web address] [facility]  [facility]  Office  [phone]  CC: Primary care physician; No primary care provider on file.  Note: This dictation was prepared with Dragon dictation along with smaller phrase technology. Any transcriptional errors that result from this process are unintentional.

## 2015-09-19 ENCOUNTER — Emergency Department
Admission: EM | Admit: 2015-09-19 | Discharge: 2015-09-19 | Disposition: A | Discharge status: Home / Self Care | Payer: Self-pay | Attending: Emergency Medicine | Admitting: Emergency Medicine

## 2015-09-19 ENCOUNTER — Emergency Department: Payer: Self-pay

## 2015-09-19 ENCOUNTER — Encounter: Payer: Self-pay | Admitting: Emergency Medicine

## 2015-09-19 DIAGNOSIS — F17210 Nicotine dependence, cigarettes, uncomplicated: Secondary | ICD-10-CM | POA: Insufficient documentation

## 2015-09-19 DIAGNOSIS — M2550 Pain in unspecified joint: Secondary | ICD-10-CM

## 2015-09-19 DIAGNOSIS — M25541 Pain in joints of right hand: Secondary | ICD-10-CM | POA: Insufficient documentation

## 2015-09-19 DIAGNOSIS — M25542 Pain in joints of left hand: Secondary | ICD-10-CM | POA: Insufficient documentation

## 2015-09-19 DIAGNOSIS — I10 Essential (primary) hypertension: Secondary | ICD-10-CM | POA: Insufficient documentation

## 2015-09-19 DIAGNOSIS — Z794 Long term (current) use of insulin: Secondary | ICD-10-CM | POA: Insufficient documentation

## 2015-09-19 DIAGNOSIS — Z7982 Long term (current) use of aspirin: Secondary | ICD-10-CM | POA: Insufficient documentation

## 2015-09-19 DIAGNOSIS — E119 Type 2 diabetes mellitus without complications: Secondary | ICD-10-CM | POA: Insufficient documentation

## 2015-09-19 DIAGNOSIS — E785 Hyperlipidemia, unspecified: Secondary | ICD-10-CM | POA: Insufficient documentation

## 2015-09-19 MED ORDER — TRAMADOL HCL 50 MG PO TABS: 50.0000 mg | ORAL_TABLET | Freq: Four times a day (QID) | ORAL | Status: DC | PRN

## 2015-09-19 MED ORDER — NAPROXEN 500 MG PO TABS
500.0000 mg | ORAL_TABLET | Freq: Once | ORAL | Status: AC
  Administered 2015-09-19: 500 mg via ORAL
  Filled 2015-09-19: qty 1

## 2015-09-19 MED ORDER — MELOXICAM 15 MG PO TABS: 15.0000 mg | ORAL_TABLET | Freq: Every day | ORAL | Status: DC

## 2015-09-19 NOTE — ED Provider Notes (Signed)
[facility] Emergency Department Provider Note ____________________________________________  Time seen: Approximately 9:48 PM  I have reviewed the triage vital signs and the nursing notes.   HISTORY  Chief Complaint Hand Pain    HPI Micheal Robinson is a 40 y.o. male who presents to emergency department for evaluation of bilateral hand pain. He states that he fell couple of days ago and landed on his outstretched hands and now has pain in all the joints of his fingers and wrists. He states that the pain has been progressively worsening each day since then. He has had no relief with ibuprofen or Tylenol. He states that the swelling in his hands had gone down last night, but today he cooked on the grill for a family reunion and the swelling has returned and the pain has increased.  Past Medical History  Diagnosis Date  . Diabetes mellitus without complication (HCC)     a. 08/2015: uncontrolled, A1c 13.4  . Hypertension   . Hyperlipidemia     Patient Active Problem List   Diagnosis Date Noted  . Atypical chest pain 09/01/2015  . Dyslipidemia 09/01/2015  . Uncontrolled type 2 diabetes mellitus (HCC) 09/01/2015  . Pain in the chest   . AKI (acute kidney injury) (HCC) 08/31/2015    Past Surgical History  Procedure Laterality Date  . Hand surgery      Current Outpatient Rx  Name  Route  Sig  Dispense  Refill  . aspirin EC 81 MG EC tablet   Oral   Take 1 tablet (81 mg total) by mouth daily.         . insulin NPH-regular Human (NOVOLIN 70/30) (70-30) 100 UNIT/ML injection   Subcutaneous   Inject 18 Units into the skin 2 (two) times daily with a meal.   10 mL   2   . lovastatin (MEVACOR) 40 MG tablet   Oral   Take 1 tablet (40 mg total) by mouth at bedtime.   30 tablet   0   . meloxicam (MOBIC) 15 MG tablet   Oral   Take 1 tablet (15 mg total) by mouth daily.   30 tablet   0   . pantoprazole (PROTONIX) 40 MG tablet   Oral   Take 1  tablet (40 mg total) by mouth daily.   15 tablet   0   . traMADol (ULTRAM) 50 MG tablet   Oral   Take 1 tablet (50 mg total) by mouth every 6 (six) hours as needed.   12 tablet   0     Allergies Review of patient's allergies indicates no known allergies.  Family History  Problem Relation Age of Onset  . Diabetes Mellitus II Maternal Grandmother   . Diabetes Mellitus II Mother   . Diabetes Mellitus II Father   . Hypertension Mother   . Hypertension Father     Social History Social History  Substance Use Topics  . Smoking status: Current Some Day Smoker    Types: Cigars  . Smokeless tobacco: None  . Alcohol Use: No    Review of Systems Constitutional: No recent illness. Cardiovascular: Denies chest pain or palpitations. Respiratory: Denies shortness of breath. Musculoskeletal: Pain in Bilateral hands Skin: Negative for rash, wound, lesion. Neurological: Negative for focal weakness or numbness.  ____________________________________________   PHYSICAL EXAM:  VITAL SIGNS: Temperature 98.2, blood pressure 138/83, respiratory rate 18, SPO2 95% on room air. ED Triage Vitals  Enc Vitals Group     BP --  Pulse --      Resp --      Temp --      Temp src --      SpO2 --      Weight --      Height --      Head Cir --      Peak Flow --      Pain Score 09/19/15 2134 10     Pain Loc --      Pain Edu? --      Excl. in GC? --     Constitutional: Alert and oriented. Well appearing and in no acute distress. Eyes: Conjunctivae are normal. EOMI. Head: Atraumatic. Neck: No stridor.  Respiratory: Normal respiratory effort.   Musculoskeletal: Full range of motion of the right hand with minimal swelling. Limited flexion of all fingers of the left hand due to pain. There is equal amount of swelling of both hands. There is full range of motion of bilateral wrists. Neurologic:  Normal speech and language. No gross focal neurologic deficits are appreciated. Speech is  normal. No gait instability. Skin:  Skin is warm, dry and intact. Atraumatic. Psychiatric: Mood and affect are normal. Speech and behavior are normal.  ____________________________________________   LABS (all labs ordered are listed, but only abnormal results are displayed)  Labs Reviewed - No data to display ____________________________________________  RADIOLOGY  Bilateral hand films are negative for acute bony abnormality per radiology. ____________________________________________   PROCEDURES  Procedure(s) performed:  None   ____________________________________________   INITIAL IMPRESSION / ASSESSMENT AND PLAN / ED COURSE  Pertinent labs & imaging results that were available during my care of the patient were reviewed by me and considered in my medical decision making (see chart for details).  Patient was prescribed tramadol and meloxicam to be taken for pain. He was advised to follow-up with his primary care provider for symptoms that are not improving with medications. He was instructed to return to the emergency department for symptoms that change or worsen if unable to schedule an appointment. ____________________________________________   FINAL CLINICAL IMPRESSION(S) / ED DIAGNOSES  Final diagnoses:  Polyarthralgia       Chinita PesterCari B Sheretha Shadd, FNP 09/19/15 2245  Sharyn CreamerMark Quale, MD 09/19/15 2353

## 2015-09-19 NOTE — Discharge Instructions (Signed)

## 2015-09-19 NOTE — ED Notes (Signed)
Micheal Robinson and caught himself 3 days ago - was cooking out today and noticed increasingly painful while cooking

## 2015-09-19 NOTE — ED Notes (Signed)
Patient complains of hand pain that started "a couple of days ago" and he has tried using ice and ibuprofen for pain relief but hs got none.  His wife has pulled on his finger and they heard some "pops" and the patient said he had a little relief then.  He feels like there is pressure in his hands.  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

## 2015-09-20 ENCOUNTER — Ambulatory Visit: Payer: Self-pay

## 2015-09-23 ENCOUNTER — Encounter: Payer: Self-pay | Admitting: Nurse Practitioner

## 2015-09-23 ENCOUNTER — Ambulatory Visit (INDEPENDENT_AMBULATORY_CARE_PROVIDER_SITE_OTHER): Payer: Self-pay | Admitting: Nurse Practitioner

## 2015-09-23 VITALS — BP 140/92 | HR 81 | Ht 66.0 in | Wt 293.8 lb

## 2015-09-23 DIAGNOSIS — N182 Chronic kidney disease, stage 2 (mild): Secondary | ICD-10-CM

## 2015-09-23 DIAGNOSIS — I10 Essential (primary) hypertension: Secondary | ICD-10-CM

## 2015-09-23 DIAGNOSIS — E119 Type 2 diabetes mellitus without complications: Secondary | ICD-10-CM

## 2015-09-23 DIAGNOSIS — R079 Chest pain, unspecified: Secondary | ICD-10-CM

## 2015-09-23 NOTE — Patient Instructions (Addendum)
Medication Instructions:  Please continue your current medications  Labwork: None  Testing/Procedures: [facility]  Your caregiver has ordered a Stress Test with nuclear imaging. The purpose of this test is to evaluate the blood supply to your heart muscle. This procedure is referred to as a "Non-Invasive Stress Test." This is because other than having an IV started in your vein, nothing is inserted or "invades" your body. Cardiac stress tests are done to find areas of poor blood flow to the heart by determining the extent of coronary artery disease (CAD). Some patients exercise on a treadmill, which naturally increases the blood flow to your heart, while others who are  unable to walk on a treadmill due to physical limitations have a pharmacologic/chemical stress agent called Lexiscan . This medicine will mimic walking on a treadmill by temporarily increasing your coronary blood flow.   Please note: these test may take anywhere between 2-4 hours to complete  PLEASE REPORT TO [facility]  THE VOLUNTEERS AT THE FIRST DESK WILL DIRECT YOU WHERE TO GO  Date of Procedure:______Monday, July 17 & Tuesday, July 18____  Arrival Time for Procedure:______7:15 am_______   Do not eat or drink after midnight  No caffeine for 24 hours prior to test  No smoking 24 hours prior to test.  Your medication may be taken with water.  If your doctor stopped a medication because of this test, do not take that medication.  Ladies, please do not wear dresses.  Skirts or pants are appropriate. Please wear a short sleeve shirt.  No perfume, cologne or lotion.  Wear comfortable walking shoes. No heels!  Follow-Up: As needed pending results of your stress test  If you need a refill on your cardiac medications before your next appointment, please call your pharmacy.  Cardiac Nuclear Scanning A cardiac nuclear scan is used to check your heart for problems, such as the following:  A portion  of the heart is not getting enough blood.  Part of the heart muscle has died, which happens with a heart attack.  The heart wall is not working normally.  In this test, a radioactive dye (tracer) is injected into your bloodstream. After the tracer has traveled to your heart, a scanning device is used to measure how much of the tracer is absorbed by or distributed to various areas of your heart. LET [facility] KNOW ABOUT:  Any allergies you have.  All medicines you are taking, including vitamins, herbs, eye drops, creams, and over-the-counter medicines.  Previous problems you or members of your family have had with the use of anesthetics.  Any blood disorders you have.  Previous surgeries you have had.  Medical conditions you have.  RISKS AND COMPLICATIONS Generally, this is a safe procedure. However, as with any procedure, problems can occur. Possible problems include:   Serious chest pain.  Rapid heartbeat.  Sensation of warmth in your chest. This usually passes quickly. BEFORE THE PROCEDURE Ask your health care provider about changing or stopping your regular medicines. PROCEDURE This procedure is usually done at a hospital and takes 2-4 hours.  An IV tube is inserted into one of your veins.  Your health care provider will inject a small amount of radioactive tracer through the tube.  You will then wait for 20-40 minutes while the tracer travels through your bloodstream.  You will lie down on an exam table so images of your heart can be taken. Images will be taken for about 15-20  minutes.  You will exercise on a treadmill or stationary bike. While you exercise, your heart activity will be monitored with an electrocardiogram (ECG), and your blood pressure will be checked.  If you are unable to exercise, you may be given a medicine to make your heart beat faster.  When blood flow to your heart has peaked, tracer will again be injected through the IV  tube.  After 20-40 minutes, you will get back on the exam table and have more images taken of your heart.  When the procedure is over, your IV tube will be removed. AFTER THE PROCEDURE  You will likely be able to leave shortly after the test. Unless your health care provider tells you otherwise, you may return to your normal schedule, including diet, activities, and medicines.  Make sure you find out how and when you will get your test results.   This information is not intended to replace advice given to you by your health care provider. Make sure you discuss any questions you have with your health care provider.   Document Released: 03/31/2004 Document Revised: 03/11/2013 Document Reviewed: 02/12/2013 Elsevier Interactive Patient Education [employer]

## 2015-09-23 NOTE — Progress Notes (Signed)
Office Visit    Patient Name: Micheal FerraraLeonard R Tewksbury Jr. Date of Encounter: 09/23/2015  Primary Care Provider:  Pcp Not In System Primary Cardiologist:  Seen by D. Herbie BaltimoreHarding, MD @ [facility] 08/2015.  Chief Complaint    40 y/o ? with a h/o HTN, obesity, and DM, who presents for f/u after recent admission for chest pain.  Past Medical History    Past Medical History  Diagnosis Date  . Diabetes mellitus without complication (HCC)     a. 08/2015: uncontrolled, A1c 13.4  . Essential hypertension   . Hyperlipidemia   . Atypical chest pain     a. 08/2015, neg troponin;  b. 08/2015 Echo: EF 50-55%, no rwma, nl RV fxn.  . Morbid obesity [facility](HCC)    Past Surgical History  Procedure Laterality Date  . Hand surgery      Allergies  No Known Allergies  History of Present Illness    40 y/o ? with a h/o HTN, DM, and obesity. [facility] with atypical, musculoskeletal c/p, and ruled out for MI.  Echo showed nl LV fxn.  With h/o HTN and DM, outpt Myoview was recommended.  Since d/c, he has not had any further chest pain.  He is fairly active w/o limitations.  He denies palpitations, dyspnea, pnd, orthopnea, n, v, dizziness, syncope, edema, weight gain, or early satiety.   Home Medications    Prior to Admission medications   Medication Sig Start Date End Date Taking? Authorizing Provider  aspirin EC 81 MG EC tablet Take 1 tablet (81 mg total) by mouth daily. 09/01/15  Yes Srikar Sudini, MD  insulin NPH-regular Human (NOVOLIN 70/30) (70-30) 100 UNIT/ML injection Inject 18 Units into the skin 2 (two) times daily with a meal. 09/01/15  Yes Srikar Sudini, MD  lovastatin (MEVACOR) 40 MG tablet Take 1 tablet (40 mg total) by mouth at bedtime. 09/01/15  Yes Srikar Sudini, MD  meloxicam (MOBIC) 15 MG tablet Take 1 tablet (15 mg total) by mouth daily. 09/19/15  Yes [name] Triplett, FNP  pantoprazole (PROTONIX) 40 MG tablet Take 1 tablet (40 mg total) by mouth daily. 09/01/15  Yes Srikar Sudini,  MD  traMADol (ULTRAM) 50 MG tablet Take 1 tablet (50 mg total) by mouth every 6 (six) hours as needed. 09/19/15  Yes Chinita Pesterari B Triplett, FNP    Review of Systems    He denies chest pain, palpitations, dyspnea, pnd, orthopnea, n, v, dizziness, syncope, edema, weight gain, or early satiety.  All other systems reviewed and are otherwise negative except as noted above.  Physical Exam    VS:  BP 140/92 mmHg  Pulse 81  Ht 5\' 6"  (1.676 m)  Wt 293 lb 12.8 oz (133.267 kg)  BMI 47.44 kg/m2 , BMI Body mass index is 47.44 kg/(m^2).   Repeat BP: 136/90  GEN: Well nourished, well developed, in no acute distress. HEENT: normal. Neck: Supple, no JVD, carotid bruits, or masses. Cardiac: RRR, no murmurs, rubs, or gallops. No clubbing, cyanosis, edema.  Radials/DP/PT 2+ and equal bilaterally.  Respiratory:  Respirations regular and unlabored, clear to auscultation bilaterally. GI: Soft, nontender, nondistended, BS + x 4. MS: no deformity or atrophy. Skin: warm and dry, no rash. Neuro:  Strength and sensation are intact. Psych: Normal affect.  Accessory Clinical Findings    ECG - RSR, 81, no acute st/t changes.  Assessment & Plan    1.  Atypical Chest Pain: pt was recently admitted with upper  sternal discomfort that was felt to be musculoskeletal in nature.  Troponins were normal and ECG non-acute.  Echo showed nl LV fxn.  Recommendation was made for outpt MV given risk factors including poorly controlled DM and HTN.  He is interested in pursuing this, which will be a 2 day study 2/2 his size.  We will arrange for this next week.    2.  Essential HTN:  BP 136/90 on repeat today. He says that he is taking lisinopril but doesn't know the dose.  I've asked him to call us with the dose so that we may update our records.  I have also asked him to check his BP @ home a few x/wk.  He does have a cuff @ home.  If BP's are consistently trending over 135 @ home, I've asked him to call us so that we may titrate his  lisinopril.  I've also asked him to be very careful with salt in his diet - boxes/cans/fast and restaurant foods, while focusing on weight loss as well.  He and his significant other were very receptive to this conversation.  3.  Poorly controlled Type II DM:  A1c 13.4 on 6/13.  He is on insulin and reports compliance.  He will f/u with primary care.  4.  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    5.  Morbid Obesity:  We discussed the importance of weight loss.  He'd likely benefit from outpt diabetes and nutritional counseling but doesn't have insurance.  6.  Dispo:  F/u myoview to r/o ischemia.  F/u in office prn if nl.  He will call re: BP readings - which may substantiate f/u.  Nicolasa Duckinghristopher Jeryn Bertoni, NP 09/23/2015, 11:54 AM

## 2015-10-01 ENCOUNTER — Encounter: Payer: Self-pay | Admitting: Nurse Practitioner

## 2015-10-01 ENCOUNTER — Telehealth (HOSPITAL_COMMUNITY): Payer: Self-pay

## 2015-10-01 NOTE — Progress Notes (Signed)
Abby from nuclear department [phone]) called regarding patients scheduled myoview test. It was ordered as a 2 day test but only 1 day was scheduled in the system. Abby wanted to make us aware that she scheduled the second day and called the patient to make them aware of the change. Due to another patient being scheduled on the second day they had to change the time to 9:00 am for the second day. Abby called the patient and he was unavailable at that time so he may call back to clarify this.

## 2015-10-01 NOTE — Telephone Encounter (Signed)
Last RN stated that she will be in contact with pt to reschedule pt as she has just cancelled his appointment that was set for testing at [facility]. I have had no other contact with this pt file other that the questioning about scheduling from other staff at [facility].

## 2015-10-04 ENCOUNTER — Encounter: Admission: RE | Admit: 2015-10-04 | Payer: Self-pay | Source: Ambulatory Visit

## 2015-10-04 ENCOUNTER — Telehealth: Payer: Self-pay | Admitting: Nurse Practitioner

## 2015-10-04 NOTE — Telephone Encounter (Signed)
Left message on machine for patient to contact the office.   

## 2015-10-04 NOTE — Telephone Encounter (Signed)
[facility] calling stating pt No-Showed for his test this morning.  He was to have a 2 day stress test.  Please advise.

## 2015-10-05 NOTE — Telephone Encounter (Signed)
Left message on machine for patient to contact the office.   

## 2016-03-05 ENCOUNTER — Emergency Department: Payer: Medicaid Other

## 2016-03-05 DIAGNOSIS — Z7982 Long term (current) use of aspirin: Secondary | ICD-10-CM | POA: Diagnosis not present

## 2016-03-05 DIAGNOSIS — F17290 Nicotine dependence, other tobacco product, uncomplicated: Secondary | ICD-10-CM | POA: Insufficient documentation

## 2016-03-05 DIAGNOSIS — I10 Essential (primary) hypertension: Secondary | ICD-10-CM | POA: Diagnosis not present

## 2016-03-05 DIAGNOSIS — Z794 Long term (current) use of insulin: Secondary | ICD-10-CM | POA: Insufficient documentation

## 2016-03-05 DIAGNOSIS — F1410 Cocaine abuse, uncomplicated: Secondary | ICD-10-CM | POA: Diagnosis not present

## 2016-03-05 DIAGNOSIS — Z791 Long term (current) use of non-steroidal anti-inflammatories (NSAID): Secondary | ICD-10-CM | POA: Diagnosis not present

## 2016-03-05 DIAGNOSIS — R42 Dizziness and giddiness: Secondary | ICD-10-CM | POA: Insufficient documentation

## 2016-03-05 DIAGNOSIS — E1165 Type 2 diabetes mellitus with hyperglycemia: Secondary | ICD-10-CM | POA: Insufficient documentation

## 2016-03-05 DIAGNOSIS — Z79899 Other long term (current) drug therapy: Secondary | ICD-10-CM | POA: Insufficient documentation

## 2016-03-05 DIAGNOSIS — R5383 Other fatigue: Secondary | ICD-10-CM | POA: Diagnosis present

## 2016-03-05 LAB — BASIC METABOLIC PANEL
Anion gap: 12 (ref 5–15)
BUN: 21 mg/dL — ABNORMAL HIGH (ref 6–20)
CALCIUM: 8.9 mg/dL (ref 8.9–10.3)
CO2: 28 mmol/L (ref 22–32)
CREATININE: 1.71 mg/dL — AB (ref 0.61–1.24)
Chloride: 95 mmol/L — ABNORMAL LOW (ref 101–111)
GFR, EST AFRICAN AMERICAN: 56 mL/min — AB (ref >60)
GFR, EST NON AFRICAN AMERICAN: 48 mL/min — AB (ref >60)
Glucose, Bld: 330 mg/dL — ABNORMAL HIGH (ref 65–99)
Potassium: 4 mmol/L (ref 3.5–5.1)
SODIUM: 135 mmol/L (ref 135–145)

## 2016-03-05 LAB — CBC
HCT: 46.2 % (ref 40.0–52.0)
Hemoglobin: 16.2 g/dL (ref 13.0–18.0)
MCH: 29.3 pg (ref 26.0–34.0)
MCHC: 35 g/dL (ref 32.0–36.0)
MCV: 83.7 fL (ref 80.0–100.0)
PLATELETS: 227 10*3/uL (ref 150–440)
RBC: 5.52 MIL/uL (ref 4.40–5.90)
RDW: 14.8 % — ABNORMAL HIGH (ref 11.5–14.5)
WBC: 7.6 10*3/uL (ref 3.8–10.6)

## 2016-03-05 LAB — GLUCOSE, CAPILLARY: Glucose-Capillary: 332 mg/dL — ABNORMAL HIGH (ref 65–99)

## 2016-03-05 MED ORDER — SODIUM CHLORIDE 0.9 % IV BOLUS (SEPSIS)
1000.0000 mL | Freq: Once | INTRAVENOUS | Status: AC
  Administered 2016-03-05: 1000 mL via INTRAVENOUS

## 2016-03-05 NOTE — ED Triage Notes (Signed)
Micheal RainwaterFiancee states when she got home from work at [employer]pm, pt was c/o dizziness/weakness and a headache.  Pt has history of diabetes and htn.  States blood sugar at home was 425 approx 1 hour ago.  Pt has wobbly gait in triage and c/o leg weakness.

## 2016-03-05 NOTE — ED Triage Notes (Signed)
Pt also admits to cocaine use and marijuana use on Friday.

## 2016-03-06 ENCOUNTER — Emergency Department
Admission: EM | Admit: 2016-03-06 | Discharge: 2016-03-06 | Disposition: A | Discharge status: Home / Self Care | Payer: Medicaid Other | Attending: Emergency Medicine | Admitting: Emergency Medicine

## 2016-03-06 DIAGNOSIS — F1410 Cocaine abuse, uncomplicated: Secondary | ICD-10-CM

## 2016-03-06 DIAGNOSIS — R739 Hyperglycemia, unspecified: Secondary | ICD-10-CM

## 2016-03-06 DIAGNOSIS — R531 Weakness: Secondary | ICD-10-CM

## 2016-03-06 DIAGNOSIS — R5383 Other fatigue: Secondary | ICD-10-CM

## 2016-03-06 LAB — URINE DRUG SCREEN, QUALITATIVE (ARMC ONLY)
Amphetamines, Ur Screen: NOT DETECTED
Barbiturates, Ur Screen: NOT DETECTED
Benzodiazepine, Ur Scrn: NOT DETECTED
CANNABINOID 50 NG, UR ~~LOC~~: NOT DETECTED
COCAINE METABOLITE, UR ~~LOC~~: POSITIVE — AB
MDMA (ECSTASY) UR SCREEN: NOT DETECTED
Methadone Scn, Ur: NOT DETECTED
OPIATE, UR SCREEN: NOT DETECTED
PHENCYCLIDINE (PCP) UR S: NOT DETECTED
Tricyclic, Ur Screen: NOT DETECTED

## 2016-03-06 LAB — GLUCOSE, CAPILLARY
Glucose-Capillary: 332 mg/dL — ABNORMAL HIGH (ref 65–99)
Glucose-Capillary: 303 mg/dL — ABNORMAL HIGH (ref 65–99)

## 2016-03-06 LAB — URINALYSIS, COMPLETE (UACMP) WITH MICROSCOPIC
Bacteria, UA: NONE SEEN
Bilirubin Urine: NEGATIVE
Ketones, ur: NEGATIVE mg/dL
Leukocytes, UA: NEGATIVE
NITRITE: NEGATIVE
PROTEIN: 100 mg/dL — AB
SPECIFIC GRAVITY, URINE: 1.014 (ref 1.005–1.030)
pH: 5 (ref 5.0–8.0)

## 2016-03-06 LAB — TROPONIN I: Troponin I: <0.03 ng/mL (ref <0.03)

## 2016-03-06 MED ORDER — GLIPIZIDE ER 5 MG PO TB24: 5.0000 mg | ORAL_TABLET | Freq: Every day | ORAL | 0 refills | Status: DC

## 2016-03-06 MED ORDER — LORAZEPAM 1 MG PO TABS
1.0000 mg | ORAL_TABLET | Freq: Once | ORAL | Status: AC
  Administered 2016-03-06: 1 mg via ORAL
  Filled 2016-03-06: qty 1

## 2016-03-06 MED ORDER — INSULIN ASPART 100 UNIT/ML ~~LOC~~ SOLN
10.0000 [IU] | Freq: Once | SUBCUTANEOUS | Status: AC
  Administered 2016-03-06: 10 [IU] via SUBCUTANEOUS
  Filled 2016-03-06: qty 10

## 2016-03-06 NOTE — ED Notes (Signed)
Pt has decided he would like to move from wheelchair to recliner; moved without assistance; callbell in reach; pt has put his feet up, laid his head back, covered himself with warm blanket; pt is sleeping soundly before this nurse left the sub wait area

## 2016-03-06 NOTE — ED Notes (Addendum)
Pt left with out paperwork and prescriptions for glipizide. RN attempted to call patient and could not get a hold of patient but left message to let patient know his rx and d/c paperwork would be up front. Pt took IV out and was found in trash with catheter intact.

## 2016-03-06 NOTE — Discharge Instructions (Signed)
1. Start glipizide 5 mg daily (#30) and addition to your metformin. 2. Stop using illicit drugs. 3. Return to the ER for worsening symptoms, persistent vomiting, difficulty breathing or other concerns.

## 2016-03-06 NOTE — ED Provider Notes (Signed)
[facility] Emergency Department Provider Note   ____________________________________________   First MD Initiated Contact with Patient 03/06/16 0214     (approximate)  I have reviewed the triage vital signs and the nursing notes.   HISTORY  Chief Complaint Fatigue    HPI Micheal FerraraLeonard R Cristiano Jr. is a 40 y.o. male who presents to the ED from home with a chief complaint ofgeneralized weakness. Patient has a history of non-insulin dependent diabetes and hypertension. Fiance reports when she got home from work at 2 PM, patient was having generalized weakness, dizziness and headache. Does not take his blood sugars at home beyond breakfast, but reports baseline sugars in the 200 range. Fiance reports blood sugar at home prior to arrival was 425. Does report polydipsia and polyuria. Denies associated slurred speech, confusion, focal extremity weakness, numbness or tingling. Denies recent fever, chills, chest pain, shortness of breath, abdominal pain, nausea, vomiting, dysuria, diarrhea. Nice recent travel or trauma. Nothing makes his symptoms better or worse. Of note, patient admitted to triage nurse that he used cocaine and marijuana 2 days ago.   Past Medical History:  Diagnosis Date  . Atypical chest pain    a. 08/2015, neg troponin;  b. 08/2015 Echo: EF 50-55%, no rwma, nl RV fxn.  . Diabetes mellitus without complication (HCC)    a. 08/2015: uncontrolled, A1c 13.4  . Essential hypertension   . Hyperlipidemia   . Morbid obesity [facility](HCC)     Patient Active Problem List   Diagnosis Date Noted  . Morbid obesity (HCC)   . Atypical chest pain 09/01/2015  . Dyslipidemia 09/01/2015  . Uncontrolled type 2 diabetes mellitus (HCC) 09/01/2015  . Pain in the chest   . AKI (acute kidney injury) (HCC) 08/31/2015    Past Surgical History:  Procedure Laterality Date  . HAND SURGERY      Prior to Admission medications   Medication Sig Start Date End Date Taking?  Authorizing Provider  aspirin EC 81 MG EC tablet Take 1 tablet (81 mg total) by mouth daily. 09/01/15   Srikar Sudini, MD  glipiZIDE (GLUCOTROL XL) 5 MG 24 hr tablet Take 1 tablet (5 mg total) by mouth daily with breakfast. 03/06/16   Irean HongJade J Ionia Schey, MD  insulin NPH-regular Human (NOVOLIN 70/30) (70-30) 100 UNIT/ML injection Inject 18 Units into the skin 2 (two) times daily with a meal. 09/01/15   Srikar Sudini, MD  lovastatin (MEVACOR) 40 MG tablet Take 1 tablet (40 mg total) by mouth at bedtime. 09/01/15   Milagros LollSrikar Sudini, MD  meloxicam (MOBIC) 15 MG tablet Take 1 tablet (15 mg total) by mouth daily. 09/19/15   Chinita Pesterari B Triplett, FNP  pantoprazole (PROTONIX) 40 MG tablet Take 1 tablet (40 mg total) by mouth daily. 09/01/15   Milagros LollSrikar Sudini, MD  traMADol (ULTRAM) 50 MG tablet Take 1 tablet (50 mg total) by mouth every 6 (six) hours as needed. 09/19/15   Chinita Pesterari B Triplett, FNP    Allergies Patient has no known allergies.  Family History  Problem Relation Age of Onset  . Diabetes Mellitus II Maternal Grandmother   . Diabetes Mellitus II Mother   . Hypertension Mother   . Diabetes Mellitus II Father   . Hypertension Father     Social History Social History  Substance Use Topics  . Smoking status: Current Some Day Smoker    Types: Cigars  . Smokeless tobacco: Never Used  . Alcohol use No    Review of Systems  Constitutional:  Positive for generalized weakness. No fever/chills. Eyes: No visual changes. ENT: No sore throat. Cardiovascular: Denies chest pain. Respiratory: Denies shortness of breath. Gastrointestinal: Positive for polydipsia. No abdominal pain.  No nausea, no vomiting.  No diarrhea.  No constipation. Genitourinary: Positive for polyuria. Negative for dysuria. Musculoskeletal: Negative for back pain. Skin: Negative for rash. Neurological: Negative for headaches, focal weakness or numbness.  10-point ROS otherwise  negative.  ____________________________________________   PHYSICAL EXAM:  VITAL SIGNS: ED Triage Vitals  Enc Vitals Group     BP 03/05/16 2139 (!) 149/103     Pulse Rate 03/05/16 2139 (!) 108     Resp 03/05/16 2139 20     Temp 03/05/16 2139 98.6 F (37 C)     Temp Source 03/05/16 2139 Oral     SpO2 03/05/16 2139 96 %     Weight 03/05/16 2143 290 lb (131.5 kg)     Height 03/05/16 2143 5\' 6"  (1.676 m)     Head Circumference --      Peak Flow --      Pain Score --      Pain Loc --      Pain Edu? --      Excl. in GC? --     Constitutional: Alert and oriented. Well appearing and in no acute distress. Eyes: Conjunctivae are normal. PERRL. EOMI. Head: Atraumatic. Nose: No congestion/rhinnorhea. Mouth/Throat: Mucous membranes are moist.  Oropharynx non-erythematous. Neck: No stridor.  Supple neck without meningismus. Cardiovascular: Normal rate, regular rhythm. Grossly normal heart sounds.  Good peripheral circulation. Respiratory: Normal respiratory effort.  No retractions. Lungs CTAB. Gastrointestinal: Obese. Soft and nontender. No distention. No abdominal bruits. No CVA tenderness. Musculoskeletal: No lower extremity tenderness nor edema.  No joint effusions. Neurologic:  Normal speech and language. No gross focal neurologic deficits are appreciated.  Skin:  Skin is warm, dry and intact. No rash noted. Psychiatric: Mood and affect are mildly anxious. Speech and behavior are normal.  ____________________________________________   LABS (all labs ordered are listed, but only abnormal results are displayed)  Labs Reviewed  GLUCOSE, CAPILLARY - Abnormal; Notable for the following:       Result Value   Glucose-Capillary 332 (*)    All other components within normal limits  BASIC METABOLIC PANEL - Abnormal; Notable for the following:    Chloride 95 (*)    Glucose, Bld 330 (*)    BUN 21 (*)    Creatinine, Ser 1.71 (*)    GFR calc non Af Amer 48 (*)    GFR calc Af Amer 56  (*)    All other components within normal limits  CBC - Abnormal; Notable for the following:    RDW 14.8 (*)    All other components within normal limits  URINALYSIS, COMPLETE (UACMP) WITH MICROSCOPIC - Abnormal; Notable for the following:    Color, Urine STRAW (*)    APPearance CLEAR (*)    Glucose, UA >=500 (*)    Hgb urine dipstick MODERATE (*)    Protein, ur 100 (*)    Squamous Epithelial / LPF 0-5 (*)    All other components within normal limits  URINE DRUG SCREEN, QUALITATIVE ([facility] ONLY) - Abnormal; Notable for the following:    Cocaine Metabolite,Ur [place] POSITIVE (*)    All other components within normal limits  GLUCOSE, CAPILLARY - Abnormal; Notable for the following:    Glucose-Capillary 332 (*)    All other components within normal limits  GLUCOSE, CAPILLARY - Abnormal;  Notable for the following:    Glucose-Capillary 303 (*)    All other components within normal limits  TROPONIN I  CBG MONITORING, ED   ____________________________________________  EKG  ED ECG REPORT I, Elva Mauro J, the attending physician, personally viewed and interpreted this ECG.   Date: 03/06/2016  EKG Time: 2142  Rate: 103  Rhythm: sinus tachycardia  Axis: LAD  Intervals:none  ST&T Change: Nonspecific  ____________________________________________  RADIOLOGY  CT head interpreted per Dr. Gwenyth Benderadparvar: No acute intracranial pathology. ____________________________________________   PROCEDURES  Procedure(s) performed: None  Procedures  Critical Care performed: No  ____________________________________________   INITIAL IMPRESSION / ASSESSMENT AND PLAN / ED COURSE  Pertinent labs & imaging results that were available during my care of the patient were reviewed by me and considered in my medical decision making (see chart for details).  40 year old male with non-insulin-dependent diabetes who presents with generalized fatigue, polydipsia and polyuria. Laboratory a urinalysis results  reveal hyperglycemia. Blood sugar remains above 300 after 1 L normal saline. Will add subcutaneous insulin and reassess.  Clinical Course as of Mar 06 620  Mon Mar 06, 2016  0620 Repeat blood sugar after insulin starting to trend down. Prescription for glipizide was provided but evidently patient took out his own IV and left prior to being discharged so he does not have paperwork nor prescription. Nurse attempted to call patient and left a message to obtain his prescription and paperwork at the front desk.  [JS]    Clinical Course User Index [JS] Irean HongJade J Breia Ocampo, MD     ____________________________________________   FINAL CLINICAL IMPRESSION(S) / ED DIAGNOSES  Final diagnoses:  Other fatigue  Generalized weakness  Cocaine abuse  Hyperglycemia      NEW MEDICATIONS STARTED DURING THIS VISIT:  Discharge Medication List as of 03/06/2016  5:18 AM    START taking these medications   Details  glipiZIDE (GLUCOTROL XL) 5 MG 24 hr tablet Take 1 tablet (5 mg total) by mouth daily with breakfast., Starting Mon 03/06/2016, Print         Note:  This document was prepared using Dragon voice recognition software and may include unintentional dictation errors.    Irean HongJade J Sindia Kowalczyk, MD 03/06/16 [id number]

## 2016-03-06 NOTE — ED Notes (Signed)
Pt told that he needed to provide a urine sample

## 2016-03-06 NOTE — ED Notes (Signed)
Pt sitting in wheelchair in subwait; offered to assist him to recliner but he has declined to move at this time; waiting for treatment room

## 2016-03-07 DIAGNOSIS — I6381 Other cerebral infarction due to occlusion or stenosis of small artery: Secondary | ICD-10-CM | POA: Insufficient documentation

## 2016-03-08 DIAGNOSIS — N182 Chronic kidney disease, stage 2 (mild): Secondary | ICD-10-CM | POA: Insufficient documentation

## 2016-03-08 DIAGNOSIS — Z720 Tobacco use: Secondary | ICD-10-CM | POA: Insufficient documentation

## 2016-03-09 DIAGNOSIS — Z6841 Body Mass Index (BMI) 40.0 and over, adult: Secondary | ICD-10-CM

## 2016-04-04 ENCOUNTER — Emergency Department
Admission: EM | Admit: 2016-04-04 | Discharge: 2016-04-04 | Disposition: A | Discharge status: Home / Self Care | Payer: Medicaid Other | Attending: Emergency Medicine | Admitting: Emergency Medicine

## 2016-04-04 ENCOUNTER — Encounter: Payer: Self-pay | Admitting: Emergency Medicine

## 2016-04-04 ENCOUNTER — Emergency Department: Payer: Medicaid Other

## 2016-04-04 DIAGNOSIS — Y939 Activity, unspecified: Secondary | ICD-10-CM | POA: Diagnosis not present

## 2016-04-04 DIAGNOSIS — I10 Essential (primary) hypertension: Secondary | ICD-10-CM | POA: Diagnosis not present

## 2016-04-04 DIAGNOSIS — Z87891 Personal history of nicotine dependence: Secondary | ICD-10-CM | POA: Insufficient documentation

## 2016-04-04 DIAGNOSIS — S8992XA Unspecified injury of left lower leg, initial encounter: Secondary | ICD-10-CM | POA: Diagnosis present

## 2016-04-04 DIAGNOSIS — Y999 Unspecified external cause status: Secondary | ICD-10-CM | POA: Insufficient documentation

## 2016-04-04 DIAGNOSIS — M79605 Pain in left leg: Secondary | ICD-10-CM | POA: Insufficient documentation

## 2016-04-04 DIAGNOSIS — Z79899 Other long term (current) drug therapy: Secondary | ICD-10-CM | POA: Diagnosis not present

## 2016-04-04 DIAGNOSIS — W07XXXA Fall from chair, initial encounter: Secondary | ICD-10-CM | POA: Diagnosis not present

## 2016-04-04 DIAGNOSIS — Y929 Unspecified place or not applicable: Secondary | ICD-10-CM | POA: Insufficient documentation

## 2016-04-04 DIAGNOSIS — Z794 Long term (current) use of insulin: Secondary | ICD-10-CM | POA: Insufficient documentation

## 2016-04-04 DIAGNOSIS — E119 Type 2 diabetes mellitus without complications: Secondary | ICD-10-CM | POA: Diagnosis not present

## 2016-04-04 DIAGNOSIS — Z7982 Long term (current) use of aspirin: Secondary | ICD-10-CM | POA: Insufficient documentation

## 2016-04-04 DIAGNOSIS — W19XXXA Unspecified fall, initial encounter: Secondary | ICD-10-CM

## 2016-04-04 HISTORY — DX: Cerebral infarction, unspecified: I63.9

## 2016-04-04 MED ORDER — LIDOCAINE 5 % EX PTCH: 1.0000 | MEDICATED_PATCH | CUTANEOUS | 0 refills | Status: AC

## 2016-04-04 MED ORDER — MELOXICAM 15 MG PO TABS: 15.0000 mg | ORAL_TABLET | Freq: Every day | ORAL | 0 refills | Status: AC

## 2016-04-04 MED ORDER — LIDOCAINE 5 % EX PTCH
1.0000 | MEDICATED_PATCH | CUTANEOUS | Status: DC
  Administered 2016-04-04: 1 via TRANSDERMAL
  Filled 2016-04-04: qty 1

## 2016-04-04 NOTE — ED Triage Notes (Signed)
Pt to ED from home c/o left leg pain.  Patient states had fall couple days ago injuring left leg.  States pain goes from shin and radiates to left foot now.  Has hx of stroke left side affected.

## 2016-04-05 NOTE — ED Provider Notes (Signed)
[facility] Emergency Department Provider Note  ____________________________________________  Time seen: Approximately 12:19 AM  I have reviewed the triage vital signs and the nursing notes.   HISTORY  Chief Complaint Fall and Leg Pain    HPI Micheal Robinson. is a 41 y.o. male presents to the emergency department with left leg pain after falling on a chair a couple days ago. Patient states that he had a stroke a couple months ago and was doing his physical therapy exercises when he lost balance and hit left leg. Patient says he has a small abrasion over front of left leg. Patient states that he has continued to have pain over front of shin. Patient has been walking with a cane since stroke. Patient is able to bear weight on leg.No calf pain. Patient has not taken anything for symptoms. No head trauma or loss of consciousness.   Past Medical History:  Diagnosis Date  . Atypical chest pain    a. 08/2015, neg troponin;  b. 08/2015 Echo: EF 50-55%, no rwma, nl RV fxn.  . Diabetes mellitus without complication (HCC)    a. 08/2015: uncontrolled, A1c 13.4  . Essential hypertension   . Hyperlipidemia   . Morbid obesity (HCC)   . Stroke [facility])     Patient Active Problem List   Diagnosis Date Noted  . Morbid obesity (HCC)   . Atypical chest pain 09/01/2015  . Dyslipidemia 09/01/2015  . Uncontrolled type 2 diabetes mellitus (HCC) 09/01/2015  . Pain in the chest   . AKI (acute kidney injury) (HCC) 08/31/2015    Past Surgical History:  Procedure Laterality Date  . HAND SURGERY      Prior to Admission medications   Medication Sig Start Date End Date Taking? Authorizing Provider  aspirin EC 81 MG EC tablet Take 1 tablet (81 mg total) by mouth daily. 09/01/15   Srikar Sudini, MD  glipiZIDE (GLUCOTROL XL) 5 MG 24 hr tablet Take 1 tablet (5 mg total) by mouth daily with breakfast. 03/06/16   Irean Hong, MD  insulin NPH-regular Human (NOVOLIN 70/30) (70-30)  100 UNIT/ML injection Inject 18 Units into the skin 2 (two) times daily with a meal. 09/01/15   Srikar Sudini, MD  lidocaine (LIDODERM) 5 % Place 1 patch onto the skin daily. Remove & Discard patch within 12 hours or as directed by MD 04/04/16 04/04/17  Enid Derry, PA-C  lovastatin (MEVACOR) 40 MG tablet Take 1 tablet (40 mg total) by mouth at bedtime. 09/01/15   Milagros Loll, MD  meloxicam (MOBIC) 15 MG tablet Take 1 tablet (15 mg total) by mouth daily. 04/04/16 04/14/16  Enid Derry, PA-C  pantoprazole (PROTONIX) 40 MG tablet Take 1 tablet (40 mg total) by mouth daily. 09/01/15   Milagros Loll, MD  traMADol (ULTRAM) 50 MG tablet Take 1 tablet (50 mg total) by mouth every 6 (six) hours as needed. 09/19/15   Chinita Pester, FNP    Allergies Cheese  Family History  Problem Relation Age of Onset  . Diabetes Mellitus II Maternal Grandmother   . Diabetes Mellitus II Mother   . Hypertension Mother   . Diabetes Mellitus II Father   . Hypertension Father     Social History Social History  Substance Use Topics  . Smoking status: Former Smoker    Types: Cigars    Quit date: 03/05/2016  . Smokeless tobacco: Never Used  . Alcohol use No     Review of Systems  Constitutional: No  fever/chills ENT: No upper respiratory complaints. Cardiovascular: No chest pain. Respiratory: No cough. No SOB. Gastrointestinal: No abdominal pain.  No nausea, no vomiting.  Genitourinary: Negative for dysuria. Skin: Negative for rash, abrasions, lacerations, ecchymosis. Neurological: Negative for headaches, numbness or tingling   ____________________________________________   PHYSICAL EXAM:  VITAL SIGNS: ED Triage Vitals  Enc Vitals Group     BP 04/04/16 2054 130/76     Pulse Rate 04/04/16 2054 96     Resp 04/04/16 2054 16     Temp 04/04/16 2054 97.6 F (36.4 C)     Temp Source 04/04/16 2054 Oral     SpO2 04/04/16 2054 95 %     Weight 04/04/16 2054 290 lb (131.5 kg)     Height 04/04/16 2054 5'  6" (1.676 m)     Head Circumference --      Peak Flow --      Pain Score 04/04/16 2055 10     Pain Loc --      Pain Edu? --      Excl. in GC? --      Constitutional: Alert and oriented. Well appearing and in no acute distress. Eyes: Conjunctivae are normal. PERRL. EOMI. Head: Atraumatic. ENT:      Ears:      Nose: No congestion/rhinnorhea.      Mouth/Throat: Mucous membranes are moist.  Neck: No stridor.   Cardiovascular: Normal rate, regular rhythm.Good peripheral circulation. 2+ dorsalis pedis and posterior tibialis pulses. Respiratory: Normal respiratory effort without tachypnea or retractions. Lungs CTAB. Good air entry to the bases with no decreased or absent breath sounds. Musculoskeletal: Full range of motion to all extremities. No gross deformities appreciated. Tenderness to palpation over right shin. No tenderness to palpation over ankle. No calf pain. Neurologic:  Normal speech and language. No gross focal neurologic deficits are appreciated. Sensation of feet intact. Skin:  Skin is warm, dry. 1 mm healing abrasion over right shin. No drainage or bleeding. Psychiatric: Mood and affect are normal. Speech and behavior are normal. Patient exhibits appropriate insight and judgement.   ____________________________________________   LABS (all labs ordered are listed, but only abnormal results are displayed)  Labs Reviewed - No data to display ____________________________________________  EKG   ____________________________________________  RADIOLOGY Micheal BatonI, Sankalp Ferrell, personally viewed and evaluated these images (plain radiographs) as part of my medical decision making, as well as reviewing the written report by the radiologist.  Dg Tibia/fibula Left  Result Date: 04/04/2016 CLINICAL DATA:  Shin pain radiating to foot after fall several days ago EXAM: LEFT TIBIA AND FIBULA - 2 VIEW COMPARISON:  None. FINDINGS: There is no evidence of acute fracture. Ankle mortise is  maintained. The knee joint is intact. No significant soft tissue swelling. Smooth cortical undulation of the tibial type metaphysis along its lateral aspect somewhat limited in assessment due to overlap from the fibula. This may be related to old remote trauma. IMPRESSION: No acute osseous appearing abnormality. No displaced fracture is identified. Should symptoms persist, follow-up radiographs in 7-10 days may help reveal a radiographically occult fracture. Consider dedicated ankle radiographs given location of the patient's reported symptoms. Electronically Signed   By: Tollie Ethavid  Kwon M.D.   On: 04/04/2016 21:28    ____________________________________________    PROCEDURES  Procedure(s) performed:    Procedures    Medications  lidocaine (LIDODERM) 5 % 1 patch (1 patch Transdermal Patch Applied 04/04/16 2243)     ____________________________________________   INITIAL IMPRESSION / ASSESSMENT AND PLAN /  ED COURSE  Pertinent labs & imaging results that were available during my care of the patient were reviewed by me and considered in my medical decision making (see chart for details).  Review of the [place] CSRS was performed in accordance of the [facility] prior to dispensing any controlled drugs.  Clinical Course     Patient's diagnosis is consistent with leg injury after fall. Exam and vital signs are reassuring. No acute bony abnormality seen on x-ray. Patient states that pain is significantly better after a lidocaine patch was applied. Patient will be discharged home with prescriptions for lidocaine patch and meloxicam. Patient is to follow up with PCP as directed. Patient is given ED precautions to return to the ED for any worsening or new symptoms.     ____________________________________________  FINAL CLINICAL IMPRESSION(S) / ED DIAGNOSES  Final diagnoses:  Fall, initial encounter      NEW MEDICATIONS STARTED DURING THIS VISIT:  Discharge Medication List as of 04/04/2016  11:23 PM    START taking these medications   Details  lidocaine (LIDODERM) 5 % Place 1 patch onto the skin daily. Remove & Discard patch within 12 hours or as directed by MD, Starting Tue 04/04/2016, Until Wed 04/04/2017, Print            This chart was dictated using voice recognition software/Dragon. Despite best efforts to proofread, errors can occur which can change the meaning. Any change was purely unintentional.    Enid Derry, PA-C 04/05/16 0024    Minna Antis, MD 04/08/16 [phone]

## 2016-07-12 ENCOUNTER — Emergency Department
Admission: EM | Admit: 2016-07-12 | Discharge: 2016-07-13 | Disposition: A | Discharge status: Home / Self Care | Payer: Medicaid Other | Attending: Emergency Medicine | Admitting: Emergency Medicine

## 2016-07-12 ENCOUNTER — Emergency Department: Payer: Medicaid Other

## 2016-07-12 DIAGNOSIS — R519 Headache, unspecified: Secondary | ICD-10-CM

## 2016-07-12 DIAGNOSIS — Z79899 Other long term (current) drug therapy: Secondary | ICD-10-CM | POA: Insufficient documentation

## 2016-07-12 DIAGNOSIS — Z7982 Long term (current) use of aspirin: Secondary | ICD-10-CM | POA: Diagnosis not present

## 2016-07-12 DIAGNOSIS — Z87891 Personal history of nicotine dependence: Secondary | ICD-10-CM | POA: Diagnosis not present

## 2016-07-12 DIAGNOSIS — E119 Type 2 diabetes mellitus without complications: Secondary | ICD-10-CM | POA: Diagnosis not present

## 2016-07-12 DIAGNOSIS — J069 Acute upper respiratory infection, unspecified: Secondary | ICD-10-CM | POA: Diagnosis not present

## 2016-07-12 DIAGNOSIS — R51 Headache: Secondary | ICD-10-CM | POA: Diagnosis present

## 2016-07-12 DIAGNOSIS — I10 Essential (primary) hypertension: Secondary | ICD-10-CM | POA: Insufficient documentation

## 2016-07-12 DIAGNOSIS — Z794 Long term (current) use of insulin: Secondary | ICD-10-CM | POA: Diagnosis not present

## 2016-07-12 DIAGNOSIS — B9789 Other viral agents as the cause of diseases classified elsewhere: Secondary | ICD-10-CM

## 2016-07-12 LAB — COMPREHENSIVE METABOLIC PANEL
ALBUMIN: 3.3 g/dL — AB (ref 3.5–5.0)
ALT: 25 U/L (ref 17–63)
ANION GAP: 10 (ref 5–15)
AST: 36 U/L (ref 15–41)
Alkaline Phosphatase: 51 U/L (ref 38–126)
BILIRUBIN TOTAL: 0.5 mg/dL (ref 0.3–1.2)
BUN: 17 mg/dL (ref 6–20)
CO2: 27 mmol/L (ref 22–32)
Calcium: 9.1 mg/dL (ref 8.9–10.3)
Chloride: 99 mmol/L — ABNORMAL LOW (ref 101–111)
Creatinine, Ser: 1.61 mg/dL — ABNORMAL HIGH (ref 0.61–1.24)
GFR, EST AFRICAN AMERICAN: 60 mL/min — AB (ref >60)
GFR, EST NON AFRICAN AMERICAN: 52 mL/min — AB (ref >60)
Glucose, Bld: 333 mg/dL — ABNORMAL HIGH (ref 65–99)
POTASSIUM: 3.8 mmol/L (ref 3.5–5.1)
Sodium: 136 mmol/L (ref 135–145)
TOTAL PROTEIN: 7.5 g/dL (ref 6.5–8.1)

## 2016-07-12 LAB — CBC WITH DIFFERENTIAL/PLATELET
BASOS PCT: 1 %
Basophils Absolute: 0.1 10*3/uL (ref 0–0.1)
Eosinophils Absolute: 0.3 10*3/uL (ref 0–0.7)
Eosinophils Relative: 6 %
HEMATOCRIT: 41.3 % (ref 40.0–52.0)
Hemoglobin: 13.8 g/dL (ref 13.0–18.0)
Lymphocytes Relative: 24 %
Lymphs Abs: 1.4 10*3/uL (ref 1.0–3.6)
MCH: 27.6 pg (ref 26.0–34.0)
MCHC: 33.3 g/dL (ref 32.0–36.0)
MCV: 82.7 fL (ref 80.0–100.0)
MONO ABS: 0.9 10*3/uL (ref 0.2–1.0)
MONOS PCT: 15 %
Neutro Abs: 3.2 10*3/uL (ref 1.4–6.5)
Neutrophils Relative %: 54 %
Platelets: 249 10*3/uL (ref 150–440)
RBC: 4.99 MIL/uL (ref 4.40–5.90)
RDW: 16.3 % — AB (ref 11.5–14.5)
WBC: 5.9 10*3/uL (ref 3.8–10.6)

## 2016-07-12 MED ORDER — IPRATROPIUM BROMIDE 0.02 % IN SOLN
0.5000 mg | Freq: Once | RESPIRATORY_TRACT | Status: AC
  Administered 2016-07-12: 0.5 mg via RESPIRATORY_TRACT
  Filled 2016-07-12: qty 2.5

## 2016-07-12 MED ORDER — ACETAMINOPHEN-CODEINE 120-12 MG/5ML PO SUSP: 5.0000 mL | Freq: Four times a day (QID) | ORAL | 0 refills | Status: AC | PRN

## 2016-07-12 MED ORDER — DIPHENHYDRAMINE HCL 50 MG/ML IJ SOLN
25.0000 mg | Freq: Once | INTRAMUSCULAR | Status: AC
  Administered 2016-07-12: 25 mg via INTRAVENOUS
  Filled 2016-07-12: qty 1

## 2016-07-12 MED ORDER — ALBUTEROL SULFATE (2.5 MG/3ML) 0.083% IN NEBU
5.0000 mg | INHALATION_SOLUTION | Freq: Once | RESPIRATORY_TRACT | Status: AC
  Administered 2016-07-12: 5 mg via RESPIRATORY_TRACT
  Filled 2016-07-12: qty 6

## 2016-07-12 MED ORDER — BENZONATATE 100 MG PO CAPS
100.0000 mg | ORAL_CAPSULE | Freq: Once | ORAL | Status: AC
  Administered 2016-07-12: 100 mg via ORAL
  Filled 2016-07-12: qty 1

## 2016-07-12 MED ORDER — FLUTICASONE PROPIONATE 50 MCG/ACT NA SUSP: 1.0000 | Freq: Every day | NASAL | 0 refills | Status: DC

## 2016-07-12 MED ORDER — PROMETHAZINE HCL 25 MG/ML IJ SOLN
25.0000 mg | Freq: Once | INTRAMUSCULAR | Status: DC
  Filled 2016-07-12: qty 1

## 2016-07-12 MED ORDER — METOCLOPRAMIDE HCL 5 MG/ML IJ SOLN
10.0000 mg | Freq: Once | INTRAMUSCULAR | Status: AC
  Administered 2016-07-12: 10 mg via INTRAVENOUS
  Filled 2016-07-12: qty 2

## 2016-07-12 MED ORDER — SODIUM CHLORIDE 0.9 % IV BOLUS (SEPSIS)
1000.0000 mL | Freq: Once | INTRAVENOUS | Status: AC
  Administered 2016-07-12: 1000 mL via INTRAVENOUS

## 2016-07-12 MED ORDER — ACETAMINOPHEN-CODEINE 120-12 MG/5ML PO SOLN
10.0000 mL | Freq: Once | ORAL | Status: AC
  Administered 2016-07-12: 10 mL via ORAL
  Filled 2016-07-12: qty 10

## 2016-07-12 NOTE — ED Notes (Signed)
Pt. Says everytime he coughs he gets a horrible headache and pain behind left eye. He also states that he has congestion in his chest. Pt. Has history of stroke.

## 2016-07-12 NOTE — ED Notes (Signed)
Gave change of shift report to [employer].N.

## 2016-07-12 NOTE — ED Provider Notes (Signed)
[facility]-EMERGENCY DEPARTMENT Provider Note   CSN: [id number] Arrival date & time: 07/12/16  2027     History   Chief Complaint Chief Complaint  Patient presents with  . Headache  . Cough  . Nasal Congestion    HPI Micheal Robinson. is a 41 y.o. male history of diabetes, hypertension, previous stroke with some right-sided weakness who presenting with cough, left eye pain, headaches. Patient states that he has been coughing for the last 3-4 days. States though every time he coughs he has left-sided headache. He has chronic weakness of the right side but is not getting worse and denies any trouble speaking. He also has a history of migraines.   The history is provided by the patient.    Past Medical History:  Diagnosis Date  . Atypical chest pain    a. 08/2015, neg troponin;  b. 08/2015 Echo: EF 50-55%, no rwma, nl RV fxn.  . Diabetes mellitus without complication (HCC)    a. 08/2015: uncontrolled, A1c 13.4  . Essential hypertension   . Hyperlipidemia   . Morbid obesity (HCC)   . Stroke [facility])     Patient Active Problem List   Diagnosis Date Noted  . Morbid obesity (HCC)   . Atypical chest pain 09/01/2015  . Dyslipidemia 09/01/2015  . Uncontrolled type 2 diabetes mellitus (HCC) 09/01/2015  . Pain in the chest   . AKI (acute kidney injury) (HCC) 08/31/2015    Past Surgical History:  Procedure Laterality Date  . HAND SURGERY         Home Medications    Prior to Admission medications   Medication Sig Start Date End Date Taking? Authorizing Provider  aspirin EC 81 MG EC tablet Take 1 tablet (81 mg total) by mouth daily. 09/01/15   Srikar Sudini, MD  glipiZIDE (GLUCOTROL XL) 5 MG 24 hr tablet Take 1 tablet (5 mg total) by mouth daily with breakfast. 03/06/16   Irean Hong, MD  insulin NPH-regular Human (NOVOLIN 70/30) (70-30) 100 UNIT/ML injection Inject 18 Units into the skin 2 (two) times daily with a meal. 09/01/15   Srikar Sudini, MD  lidocaine (LIDODERM) 5 %  Place 1 patch onto the skin daily. Remove & Discard patch within 12 hours or as directed by MD 04/04/16 04/04/17  Enid Derry, PA-C  lovastatin (MEVACOR) 40 MG tablet Take 1 tablet (40 mg total) by mouth at bedtime. 09/01/15   Srikar Sudini, MD  pantoprazole (PROTONIX) 40 MG tablet Take 1 tablet (40 mg total) by mouth daily. 09/01/15   Milagros Loll, MD  traMADol (ULTRAM) 50 MG tablet Take 1 tablet (50 mg total) by mouth every 6 (six) hours as needed. 09/19/15   Chinita Pester, FNP    Family History Family History  Problem Relation Age of Onset  . Diabetes Mellitus II Maternal Grandmother   . Diabetes Mellitus II Mother   . Hypertension Mother   . Diabetes Mellitus II Father   . Hypertension Father     Social History Social History  Substance Use Topics  . Smoking status: Former Smoker    Types: Cigars    Quit date: 03/05/2016  . Smokeless tobacco: Never Used  . Alcohol use No     Allergies   Cheese   Review of Systems Review of Systems  Respiratory: Positive for cough.   Neurological: Positive for headaches.  All other systems reviewed and are negative.    Physical Exam Updated Vital Signs BP 122/74   Pulse 96  Temp 98.3 F (36.8 C) (Oral)   Resp 18   Ht  (1.676 m)   Wt (!) 315 lb (142.9 kg)   SpO2 92%   BMI 50.84 kg/m   Physical Exam  Constitutional: He is oriented to person, place, and time.  Uncomfortable   HENT:  Head: Normocephalic.  Mouth/Throat: Oropharynx is clear and moist.  Eyes: EOM are normal. Pupils are equal, round, and reactive to light.  Neck: Normal range of motion. Neck supple.  Cardiovascular: Normal rate, regular rhythm and normal heart sounds.   Pulmonary/Chest: Effort normal.  Diminished throughout, no wheezing or crackles   Abdominal: Soft. Bowel sounds are normal. He exhibits no distension. There is no tenderness. There is no guarding.  Musculoskeletal: Normal range of motion.  Neurological: He is alert and oriented to  person, place, and time. No cranial nerve deficit. Coordination normal.  CN 2-12 intact. Strength 4/5 R side (chronic), 5/5 L side   Skin: Skin is warm.  Psychiatric: He has a normal mood and affect.  Nursing note and vitals reviewed.    ED Treatments / Results  Labs (all labs ordered are listed, but only abnormal results are displayed) Labs Reviewed  CBC WITH DIFFERENTIAL/PLATELET - Abnormal; Notable for the following:       Result Value   RDW 16.3 (*)    All other components within normal limits  COMPREHENSIVE METABOLIC PANEL - Abnormal; Notable for the following:    Chloride 99 (*)    Glucose, Bld 333 (*)    Creatinine, Ser 1.61 (*)    Albumin 3.3 (*)    GFR calc non Af Amer 52 (*)    GFR calc Af Amer 60 (*)    All other components within normal limits    EKG  EKG Interpretation None       Radiology Dg Chest 2 View  Result Date: 07/12/2016 CLINICAL DATA:  Cough and fever since Saturday EXAM: CHEST  2 VIEW COMPARISON:  08/31/2015 FINDINGS: The heart size and mediastinal contours are within normal limits. Both lungs are clear. The visualized skeletal structures are nonacute with chronic mild degenerative change about the [facility] joints and dorsal spine. IMPRESSION: No active cardiopulmonary disease. Electronically Signed   By: Tollie Eth M.D.   On: 07/12/2016 21:18   Ct Head Wo Contrast  Result Date: 07/12/2016 CLINICAL DATA:  Initial evaluation for acute headache with cough. History of stroke. EXAM: CT HEAD WITHOUT CONTRAST TECHNIQUE: Contiguous axial images were obtained from the base of the skull through the vertex without intravenous contrast. COMPARISON:  Prior CT from 03/05/2016. FINDINGS: Brain: Cerebral volume within normal limits for age. No acute intracranial hemorrhage. No evidence for acute large vessel territory infarct. Small remote lacunar type infarct noted within the posterior limb of the right internal capsule. No mass lesion, midline shift or mass effect. No  hydrocephalus. No extra-axial fluid collection. Vascular: No asymmetric hyperdense vessel. Skull: Scalp soft tissues demonstrate no acute abnormality. Calvarium intact. Sinuses/Orbits: Globes and orbital soft tissues within normal limits. Mild scattered mucosal thickening within the ethmoidal air cells. Paranasal sinuses are otherwise clear. Small right mastoid effusion noted. Left mastoid air cells clear. Other: None. IMPRESSION: 1. No acute intracranial process. 2. Small remote lacunar infarct within the posterior limb of the right internal capsule. 3. Otherwise normal head CT. Electronically Signed   By: Rise Mu M.D.   On: 07/12/2016 22:46    Procedures Procedures (including critical care time)  Medications Ordered in ED Medications  promethazine (PHENERGAN) injection 25 mg (not administered)  sodium chloride 0.9 % bolus 1,000 mL (1,000 mLs Intravenous New Bag/Given 07/12/16 2141)  metoCLOPramide (REGLAN) injection 10 mg (10 mg Intravenous Given 07/12/16 2136)  diphenhydrAMINE (BENADRYL) injection 25 mg (25 mg Intravenous Given 07/12/16 2136)  albuterol (PROVENTIL) (2.5 MG/3ML) 0.083% nebulizer solution 5 mg (5 mg Nebulization Given 07/12/16 2123)  ipratropium (ATROVENT) nebulizer solution 0.5 mg (0.5 mg Nebulization Given 07/12/16 2123)  benzonatate (TESSALON) capsule 100 mg (100 mg Oral Given 07/12/16 2148)     Initial Impression / Assessment and Plan / ED Course  I have reviewed the triage vital signs and the nursing notes.  Pertinent labs & imaging results that were available during my care of the patient were reviewed by me and considered in my medical decision making (see chart for details).     Orlondo Holycross. is a 41 y.o. male here with cough, headaches. Likely bronchitis. Has hx of stroke and has R sided weakness that is chronic. No new neurologic deficit. Likely migraines. Will get labs, CXR, CT head. Will give migraine cocktail.   11:23 PM Felt better. Still  coughing some. CT head showed no acute infarct. CXR clear. Will give tylenol with codeine for cough.   Final Clinical Impressions(s) / ED Diagnoses   Final diagnoses:  None    New Prescriptions New Prescriptions   No medications on file     Charlynne Pander, MD 07/12/16 2323

## 2016-07-12 NOTE — Discharge Instructions (Signed)
Stay hydrated.   Take tylenol with codeine as needed for cough.   Take motrin, tylenol for headaches.   See your doctor.   Return to ER if you have worse cough, headaches, vomiting, fevers, weakness, numbness

## 2016-07-13 NOTE — ED Notes (Signed)
Pt. Verbalizes understanding of d/c instructions, prescriptions, and follow-up. VS stable and pt talking on phone without evidence of distress.  Pt. In NAD at time of d/c and denies further concerns regarding this visit. Pt. Stable at the time of departure from the unit, departing unit by the safest and most appropriate manner per that pt condition and limitations. Pt advised to return to the ED at any time for emergent concerns, or for new/worsening symptoms.

## 2017-03-06 ENCOUNTER — Encounter: Payer: Self-pay | Admitting: Emergency Medicine

## 2017-03-06 ENCOUNTER — Emergency Department
Admission: EM | Admit: 2017-03-06 | Discharge: 2017-03-06 | Disposition: A | Discharge status: Home / Self Care | Payer: Medicaid Other | Attending: Emergency Medicine | Admitting: Emergency Medicine

## 2017-03-06 ENCOUNTER — Other Ambulatory Visit: Payer: Self-pay

## 2017-03-06 DIAGNOSIS — Z8673 Personal history of transient ischemic attack (TIA), and cerebral infarction without residual deficits: Secondary | ICD-10-CM | POA: Insufficient documentation

## 2017-03-06 DIAGNOSIS — H109 Unspecified conjunctivitis: Secondary | ICD-10-CM | POA: Insufficient documentation

## 2017-03-06 DIAGNOSIS — E119 Type 2 diabetes mellitus without complications: Secondary | ICD-10-CM | POA: Insufficient documentation

## 2017-03-06 DIAGNOSIS — Z79899 Other long term (current) drug therapy: Secondary | ICD-10-CM | POA: Insufficient documentation

## 2017-03-06 DIAGNOSIS — H209 Unspecified iridocyclitis: Secondary | ICD-10-CM

## 2017-03-06 DIAGNOSIS — Z794 Long term (current) use of insulin: Secondary | ICD-10-CM | POA: Diagnosis not present

## 2017-03-06 DIAGNOSIS — Z7982 Long term (current) use of aspirin: Secondary | ICD-10-CM | POA: Diagnosis not present

## 2017-03-06 DIAGNOSIS — Z87891 Personal history of nicotine dependence: Secondary | ICD-10-CM | POA: Insufficient documentation

## 2017-03-06 DIAGNOSIS — I10 Essential (primary) hypertension: Secondary | ICD-10-CM | POA: Insufficient documentation

## 2017-03-06 DIAGNOSIS — H5711 Ocular pain, right eye: Secondary | ICD-10-CM | POA: Diagnosis present

## 2017-03-06 MED ORDER — FLUORESCEIN SODIUM 1 MG OP STRP
1.0000 | ORAL_STRIP | Freq: Once | OPHTHALMIC | Status: AC
  Administered 2017-03-06: 1 via OPHTHALMIC
  Filled 2017-03-06: qty 1

## 2017-03-06 MED ORDER — ERYTHROMYCIN 5 MG/GM OP OINT: 1.0000 "application " | TOPICAL_OINTMENT | Freq: Three times a day (TID) | OPHTHALMIC | 0 refills | Status: AC

## 2017-03-06 MED ORDER — ERYTHROMYCIN 5 MG/GM OP OINT
TOPICAL_OINTMENT | Freq: Once | OPHTHALMIC | Status: AC
  Administered 2017-03-06: 1 via OPHTHALMIC
  Filled 2017-03-06: qty 1

## 2017-03-06 MED ORDER — TETRACAINE HCL 0.5 % OP SOLN
1.0000 [drp] | Freq: Once | OPHTHALMIC | Status: AC
  Administered 2017-03-06: 1 [drp] via OPHTHALMIC
  Filled 2017-03-06: qty 4

## 2017-03-06 NOTE — ED Notes (Signed)
Pt and family provided surplus of crackers-grahams and saltines, peanut butter, gingerales and ice cream per request.

## 2017-03-06 NOTE — ED Provider Notes (Signed)
[facility] Emergency Department Provider Note   ____________________________________________   First MD Initiated Contact with Patient 03/06/17 0507     (approximate)  I have reviewed the triage vital signs and the nursing notes.   HISTORY  Chief Complaint Eye Problem    HPI Micheal FerraraLeonard R Gobin Jr. is a 41 y.o. male who comes into the hospital today with some right eye pain.  He states that he was sleeping on the couch a couple of days ago when he laid on his right side.  He thinks that he damaged his right eye.  He reports that when he woke up his eye was red and blurry.  He flushed it out thinking something may have been in it but it has not helped.  He reports that every time he put water on it it burns more.  The patient states that he also tried to wait it out but this is been going on for 2 days and is not any better.  The patient states that his eyes are red and his vision is blurry.  He does have some mild watery drainage.  The patient decided to come into the hospital today for evaluation.  His pain is a 10 out of 10 in intensity.  He reports that the lights hurt his eyes as well.   Past Medical History:  Diagnosis Date  . Atypical chest pain    a. 08/2015, neg troponin;  b. 08/2015 Echo: EF 50-55%, no rwma, nl RV fxn.  . Diabetes mellitus without complication (HCC)    a. 08/2015: uncontrolled, A1c 13.4  . Essential hypertension   . Hyperlipidemia   . Morbid obesity (HCC)   . Stroke [facility](HCC)     Patient Active Problem List   Diagnosis Date Noted  . Morbid obesity (HCC)   . Atypical chest pain 09/01/2015  . Dyslipidemia 09/01/2015  . Uncontrolled type 2 diabetes mellitus (HCC) 09/01/2015  . Pain in the chest   . AKI (acute kidney injury) (HCC) 08/31/2015    Past Surgical History:  Procedure Laterality Date  . HAND SURGERY      Prior to Admission medications   Medication Sig Start Date End Date Taking? Authorizing Provider    acetaminophen-codeine 120-12 MG/5ML suspension Take 5 mLs by mouth every 6 (six) hours as needed for pain. 07/12/16 07/12/17  [name], [name], MD  aspirin EC 81 MG EC tablet Take 1 tablet (81 mg total) by mouth daily. 09/01/15   [name], Srikar, MD  erythromycin [facility](ROMYCIN) ophthalmic ointment Place 1 application into the right eye 3 (three) times daily for 7 days. 03/06/17 03/13/17  Rebecka ApleyWebster, [name], MD  fluticasone (FLONASE) 50 MCG/ACT nasal spray Place 1 spray into both nostrils daily. 07/12/16 07/12/17  [name], [name], MD  glipiZIDE (GLUCOTROL XL) 5 MG 24 hr tablet Take 1 tablet (5 mg total) by mouth daily with breakfast. 03/06/16   [name], [name], MD  insulin NPH-regular Human (NOVOLIN 70/30) (70-30) 100 UNIT/ML injection Inject 18 Units into the skin 2 (two) times daily with a meal. 09/01/15   Sudini, Srikar, MD  lidocaine (LIDODERM) 5 % Place 1 patch onto the skin daily. Remove & Discard patch within 12 hours or as directed by MD 04/04/16 04/04/17  [name], [name], PA-C  lovastatin (MEVACOR) 40 MG tablet Take 1 tablet (40 mg total) by mouth at bedtime. 09/01/15   [name], Srikar, MD  pantoprazole (PROTONIX) 40 MG tablet Take 1 tablet (40 mg total) by mouth daily.  09/01/15   [name], Srikar, MD  traMADol (ULTRAM) 50 MG tablet Take 1 tablet (50 mg total) by mouth every 6 (six) hours as needed. 09/19/15   Chinita Pesterriplett, [name], FNP    Allergies Cheese  Family History  Problem Relation Age of Onset  . Diabetes Mellitus II Maternal Grandmother   . Diabetes Mellitus II Mother   . Hypertension Mother   . Diabetes Mellitus II Father   . Hypertension Father     Social History Social History   Tobacco Use  . Smoking status: Former Smoker    Types: Cigars    Last attempt to quit: 03/05/2016    Years since quitting: 1.0  . Smokeless tobacco: Never Used  Substance Use Topics  . Alcohol use: No    Alcohol/week: 0.0 oz  . Drug use: No    Comment: last 1 month ago    Review of  Systems  Constitutional: No fever/chills Eyes: No visual changes. ENT: Right eye pain Cardiovascular: Denies chest pain. Respiratory: Denies shortness of breath. Gastrointestinal: No abdominal pain.  No nausea, no vomiting.  No diarrhea.  No constipation. Genitourinary: Negative for dysuria. Musculoskeletal: Negative for back pain. Skin: Negative for rash. Neurological: Negative for headaches, focal weakness or numbness.   ____________________________________________   PHYSICAL EXAM:  VITAL SIGNS: ED Triage Vitals  Enc Vitals Group     BP 03/06/17 0132 (!) 142/80     Pulse Rate 03/06/17 0132 (!) 110     Resp 03/06/17 0132 18     Temp 03/06/17 0132 97.7 F (36.5 C)     Temp Source 03/06/17 0132 Oral     SpO2 03/06/17 0132 98 %     Weight 03/06/17 0130 293 lb (132.9 kg)     Height 03/06/17 0130 5\' 6"  (1.676 m)     Head Circumference --      Peak Flow --      Pain Score 03/06/17 0130 9     Pain Loc --      Pain Edu? --      Excl. in GC? --     Constitutional: Alert and oriented. Well appearing and in no acute distress. Eyes: Conjunctiva injected on right eye with some mild scleral erythema as well.  No uptake on fluorescein stain Head: Atraumatic. Nose: No congestion/rhinnorhea. Mouth/Throat: Mucous membranes are moist.  Oropharynx non-erythematous. Cardiovascular: Normal rate, regular rhythm. Grossly normal heart sounds.  Good peripheral circulation. Respiratory: Normal respiratory effort.  No retractions. Lungs CTAB. Gastrointestinal: Soft and nontender. No distention.  Positive bowel sounds Musculoskeletal: No lower extremity tenderness nor edema.   Neurologic:  Normal speech and language.  Skin:  Skin is warm, dry and intact.  Psychiatric: Mood and affect are normal.   ____________________________________________   LABS (all labs ordered are listed, but only abnormal results are displayed)  Labs Reviewed - No data to  display ____________________________________________  EKG  none ____________________________________________  RADIOLOGY  No results found.  ____________________________________________   PROCEDURES  Procedure(s) performed: None  Procedures  Critical Care performed: No  ____________________________________________   INITIAL IMPRESSION / ASSESSMENT AND PLAN / ED COURSE  As part of my medical decision making, I reviewed the following data within the electronic MEDICAL RECORD NUMBER Notes from prior ED visits and [place] Controlled Substance Database   This is a 41 year old male who comes into the hospital today with some right eye pain and redness.  The patient states that this started about 2 days ago.  Differential diagnosis includes corneal  abrasion, conjunctivitis, iritis.  Patient does not have a corneal abrasion on exam.  He likely has some conjunctivitis or may be some iritis.  I will give the patient some erythromycin ointment to his eye.  I will have the patient follow-up with ophthalmology for further evaluation of iritis.  He will be discharged home to follow-up with Dr. Inez Pilgrim.  He has no further questions or concerns at this time.      ____________________________________________   FINAL CLINICAL IMPRESSION(S) / ED DIAGNOSES  Final diagnoses:  Conjunctivitis of right eye, unspecified conjunctivitis type  Iritis     ED Discharge Orders        Ordered    erythromycin [facility]) ophthalmic ointment  3 times daily     03/06/17 4098       Note:  This document was prepared using Dragon voice recognition software and may include unintentional dictation errors.    Rebecka Apley, MD 03/06/17 [phone]

## 2017-03-06 NOTE — ED Notes (Signed)
Pt. Verbalizes understanding of d/c instructions, medications, and follow-up. VS stable.  Pt. In NAD at time of d/c and denies further concerns regarding this visit. Pt. Stable at the time of departure from the unit, departing unit by the safest and most appropriate manner per that pt condition and limitations with all belongings accounted for. Pt advised to return to the ED at any time for emergent concerns, or for new/worsening symptoms.   

## 2017-03-06 NOTE — Discharge Instructions (Signed)
Please follow-up with ophthalmology for further evaluation of your eye redness and pain.  Please return with any other conditions or any other concerns.

## 2017-03-06 NOTE — ED Triage Notes (Addendum)
Patient ambulatory to triage with steady gait, without difficulty or distress noted; pt reports since yesterday having right eye redness & pain with photosensitivity; pt reports that he normally wears corrective glasses and has cataracts; visual acuity right eye 20/100 and left eye 20/200

## 2017-09-10 ENCOUNTER — Ambulatory Visit: Payer: Self-pay | Admitting: Podiatry

## 2017-10-18 ENCOUNTER — Ambulatory Visit: Payer: Self-pay | Admitting: Podiatry

## 2018-01-24 ENCOUNTER — Ambulatory Visit (INDEPENDENT_AMBULATORY_CARE_PROVIDER_SITE_OTHER): Payer: Medicaid Other

## 2018-01-24 ENCOUNTER — Ambulatory Visit: Payer: Medicaid Other | Admitting: Podiatry

## 2018-01-24 ENCOUNTER — Encounter: Payer: Self-pay | Admitting: Podiatry

## 2018-01-24 VITALS — BP 141/90 | HR 117

## 2018-01-24 DIAGNOSIS — M2141 Flat foot [pes planus] (acquired), right foot: Secondary | ICD-10-CM

## 2018-01-24 DIAGNOSIS — M205X1 Other deformities of toe(s) (acquired), right foot: Secondary | ICD-10-CM

## 2018-01-24 DIAGNOSIS — M722 Plantar fascial fibromatosis: Secondary | ICD-10-CM | POA: Diagnosis not present

## 2018-01-24 DIAGNOSIS — B351 Tinea unguium: Secondary | ICD-10-CM

## 2018-01-24 DIAGNOSIS — M2021 Hallux rigidus, right foot: Secondary | ICD-10-CM

## 2018-01-24 DIAGNOSIS — M79674 Pain in right toe(s): Secondary | ICD-10-CM

## 2018-01-24 DIAGNOSIS — M79675 Pain in left toe(s): Secondary | ICD-10-CM

## 2018-01-24 DIAGNOSIS — M2142 Flat foot [pes planus] (acquired), left foot: Secondary | ICD-10-CM

## 2018-01-24 MED ORDER — AMMONIUM LACTATE 12 % EX CREA: TOPICAL_CREAM | CUTANEOUS | 0 refills | Status: AC | PRN

## 2018-01-24 MED ORDER — MELOXICAM 15 MG PO TABS: 15.0000 mg | ORAL_TABLET | Freq: Every day | ORAL | 0 refills | Status: DC

## 2018-01-24 NOTE — Progress Notes (Signed)
This patient presents to the office with chief complaint of long thick nails and diabetic feet.  This patient  says there is also severe pain noted in both heels especially in his heels.  He says he has difficulty walking due to the pain in his feet.  This patient says there are long thick painful nails.  These nails are painful walking and wearing shoes.  Patient has no history of infection or drainage from both feet.  Patient is unable to  self treat his own nails .Patient also says he has callus noted on the bottom of both feet and has breaks in the skin both heels.   This patient presents  to the office today for treatment of the  long nails and a foot evaluation due to history of  diabetes.  General Appearance  Alert, conversant and in no acute stress.  Vascular  Dorsalis pedis and posterior tibial  pulses are weakly  palpable  bilaterally.  Capillary return is within normal limits  bilaterally. Temperature is within normal limits  bilaterally.  Neurologic  Senn-Weinstein monofilament wire test within normal limits  bilaterally. Muscle power within normal limits bilaterally.  Nails Thick disfigured discolored nails with subungual debris  from hallux to fifth toes bilaterally. No evidence of bacterial infection or drainage bilaterally.  Orthopedic  No limitations of motion of motion feet .  No crepitus or effusions noted.  Palpable pain noted at the insertion of the plantar fascia right foot greater than left.  Patient has pes planus foot type upon standing.  He has a flexible pes planus foot type.  Patient has limited range of motion the first MPJ of the right foot relative to the left foot.  Skin plantar keratosis noted on the plantar aspect of both feet.  Patient also has fissures noted at the site of the thick callus heels right foot greater than the left.  Onychomycosis  Plantar fasciitis B/L  Hallux limitus 1st MPJ  B/L.  Pain is  IE  Debride nails x 10.  A diabetic foot exam was performed  and there is no evidence of any vascular or neurologic pathology.  Patient was found to have severe plantar fasciitis right foot due to hallux limitus deformity first MPJ right foot.  X-rays revealed no bony pathology aside from dorsal crowning of the first metatarsal and a decreased calcaneal inclination angle.  This is consistent with pes planus.  Patient was dispensed power step insoles to be worn in his shoes.  I considered prescribing Mobic but patient does have kidney pathology and I was hesitant and chose not to approve the Mobic.  Lac-Hydrin was called in for this patient and he was told to apply to his feet.  Patient is to return to the office in 2 weeks at which time we will reconsider and reevaluate his condition and provide further treatment. RTC 2 weeks.   Helane Gunther DPM

## 2018-02-11 ENCOUNTER — Ambulatory Visit: Payer: Medicaid Other | Admitting: Podiatry

## 2018-08-21 ENCOUNTER — Inpatient Hospital Stay
Admission: EM | Admit: 2018-08-21 | Discharge: 2018-08-27 | DRG: 637 | Discharge status: Against Medical Advice | Payer: Medicaid Other | Attending: Internal Medicine | Admitting: Internal Medicine

## 2018-08-21 ENCOUNTER — Other Ambulatory Visit: Payer: Self-pay

## 2018-08-21 ENCOUNTER — Emergency Department: Payer: Medicaid Other

## 2018-08-21 ENCOUNTER — Encounter: Payer: Self-pay | Admitting: Emergency Medicine

## 2018-08-21 DIAGNOSIS — L03314 Cellulitis of groin: Secondary | ICD-10-CM | POA: Diagnosis present

## 2018-08-21 DIAGNOSIS — E1165 Type 2 diabetes mellitus with hyperglycemia: Secondary | ICD-10-CM | POA: Diagnosis present

## 2018-08-21 DIAGNOSIS — Z833 Family history of diabetes mellitus: Secondary | ICD-10-CM | POA: Diagnosis not present

## 2018-08-21 DIAGNOSIS — N3289 Other specified disorders of bladder: Secondary | ICD-10-CM | POA: Diagnosis not present

## 2018-08-21 DIAGNOSIS — N179 Acute kidney failure, unspecified: Secondary | ICD-10-CM | POA: Diagnosis present

## 2018-08-21 DIAGNOSIS — Z8673 Personal history of transient ischemic attack (TIA), and cerebral infarction without residual deficits: Secondary | ICD-10-CM | POA: Diagnosis not present

## 2018-08-21 DIAGNOSIS — N4889 Other specified disorders of penis: Secondary | ICD-10-CM | POA: Diagnosis present

## 2018-08-21 DIAGNOSIS — Z87891 Personal history of nicotine dependence: Secondary | ICD-10-CM | POA: Diagnosis not present

## 2018-08-21 DIAGNOSIS — E785 Hyperlipidemia, unspecified: Secondary | ICD-10-CM | POA: Diagnosis present

## 2018-08-21 DIAGNOSIS — N5089 Other specified disorders of the male genital organs: Secondary | ICD-10-CM | POA: Diagnosis present

## 2018-08-21 DIAGNOSIS — Z794 Long term (current) use of insulin: Secondary | ICD-10-CM | POA: Diagnosis not present

## 2018-08-21 DIAGNOSIS — E11628 Type 2 diabetes mellitus with other skin complications: Secondary | ICD-10-CM | POA: Diagnosis present

## 2018-08-21 DIAGNOSIS — Z79899 Other long term (current) drug therapy: Secondary | ICD-10-CM

## 2018-08-21 DIAGNOSIS — L03115 Cellulitis of right lower limb: Secondary | ICD-10-CM | POA: Diagnosis present

## 2018-08-21 DIAGNOSIS — J9601 Acute respiratory failure with hypoxia: Secondary | ICD-10-CM | POA: Diagnosis not present

## 2018-08-21 DIAGNOSIS — Z8249 Family history of ischemic heart disease and other diseases of the circulatory system: Secondary | ICD-10-CM | POA: Diagnosis not present

## 2018-08-21 DIAGNOSIS — Z6841 Body Mass Index (BMI) 40.0 and over, adult: Secondary | ICD-10-CM | POA: Diagnosis not present

## 2018-08-21 DIAGNOSIS — R1031 Right lower quadrant pain: Secondary | ICD-10-CM | POA: Diagnosis not present

## 2018-08-21 DIAGNOSIS — E1122 Type 2 diabetes mellitus with diabetic chronic kidney disease: Secondary | ICD-10-CM | POA: Diagnosis present

## 2018-08-21 DIAGNOSIS — R739 Hyperglycemia, unspecified: Secondary | ICD-10-CM

## 2018-08-21 DIAGNOSIS — L03311 Cellulitis of abdominal wall: Secondary | ICD-10-CM | POA: Diagnosis present

## 2018-08-21 DIAGNOSIS — L02214 Cutaneous abscess of groin: Secondary | ICD-10-CM | POA: Diagnosis present

## 2018-08-21 DIAGNOSIS — I129 Hypertensive chronic kidney disease with stage 1 through stage 4 chronic kidney disease, or unspecified chronic kidney disease: Secondary | ICD-10-CM | POA: Diagnosis present

## 2018-08-21 DIAGNOSIS — N183 Chronic kidney disease, stage 3 (moderate): Secondary | ICD-10-CM | POA: Diagnosis present

## 2018-08-21 DIAGNOSIS — Z1159 Encounter for screening for other viral diseases: Secondary | ICD-10-CM

## 2018-08-21 DIAGNOSIS — L0390 Cellulitis, unspecified: Secondary | ICD-10-CM | POA: Diagnosis present

## 2018-08-21 DIAGNOSIS — J811 Chronic pulmonary edema: Secondary | ICD-10-CM

## 2018-08-21 HISTORY — DX: Failed or difficult intubation, initial encounter: T88.4XXA

## 2018-08-21 LAB — COMPREHENSIVE METABOLIC PANEL
ALT: 22 U/L (ref 0–44)
AST: 19 U/L (ref 15–41)
Albumin: 2.6 g/dL — ABNORMAL LOW (ref 3.5–5.0)
Alkaline Phosphatase: 71 U/L (ref 38–126)
Anion gap: 13 (ref 5–15)
BUN: 24 mg/dL — ABNORMAL HIGH (ref 6–20)
CO2: 23 mmol/L (ref 22–32)
Calcium: 7.8 mg/dL — ABNORMAL LOW (ref 8.9–10.3)
Chloride: 93 mmol/L — ABNORMAL LOW (ref 98–111)
Creatinine, Ser: 2.04 mg/dL — ABNORMAL HIGH (ref 0.61–1.24)
GFR calc Af Amer: 45 mL/min — ABNORMAL LOW (ref >60)
GFR calc non Af Amer: 39 mL/min — ABNORMAL LOW (ref >60)
Glucose, Bld: 661 mg/dL (ref 70–99)
Potassium: 4.1 mmol/L (ref 3.5–5.1)
Sodium: 129 mmol/L — ABNORMAL LOW (ref 135–145)
Total Bilirubin: 0.6 mg/dL (ref 0.3–1.2)
Total Protein: 7.2 g/dL (ref 6.5–8.1)

## 2018-08-21 LAB — CBC WITH DIFFERENTIAL/PLATELET
Abs Immature Granulocytes: 0.08 10*3/uL — ABNORMAL HIGH (ref 0.00–0.07)
Basophils Absolute: 0 10*3/uL (ref 0.0–0.1)
Basophils Relative: 0 %
Eosinophils Absolute: 0.3 10*3/uL (ref 0.0–0.5)
Eosinophils Relative: 3 %
HCT: 42.8 % (ref 39.0–52.0)
Hemoglobin: 14.5 g/dL (ref 13.0–17.0)
Immature Granulocytes: 1 %
Lymphocytes Relative: 11 %
Lymphs Abs: 1.2 10*3/uL (ref 0.7–4.0)
MCH: 27.6 pg (ref 26.0–34.0)
MCHC: 33.9 g/dL (ref 30.0–36.0)
MCV: 81.5 fL (ref 80.0–100.0)
Monocytes Absolute: 1.1 10*3/uL — ABNORMAL HIGH (ref 0.1–1.0)
Monocytes Relative: 10 %
Neutro Abs: 8.1 10*3/uL — ABNORMAL HIGH (ref 1.7–7.7)
Neutrophils Relative %: 75 %
Platelets: 203 10*3/uL (ref 150–400)
RBC: 5.25 MIL/uL (ref 4.22–5.81)
RDW: 14.1 % (ref 11.5–15.5)
WBC: 10.9 10*3/uL — ABNORMAL HIGH (ref 4.0–10.5)
nRBC: 0 % (ref 0.0–0.2)

## 2018-08-21 LAB — CK: Total CK: 277 U/L (ref 49–397)

## 2018-08-21 LAB — GLUCOSE, CAPILLARY
Glucose-Capillary: 548 mg/dL (ref 70–99)
Glucose-Capillary: 582 mg/dL (ref 70–99)
Glucose-Capillary: 573 mg/dL (ref 70–99)
Glucose-Capillary: 442 mg/dL — ABNORMAL HIGH (ref 70–99)

## 2018-08-21 LAB — LACTIC ACID, PLASMA: Lactic Acid, Venous: 2.4 mmol/L (ref 0.5–1.9)

## 2018-08-21 LAB — SARS CORONAVIRUS 2 BY RT PCR (HOSPITAL ORDER, PERFORMED IN ~~LOC~~ HOSPITAL LAB): SARS Coronavirus 2: NEGATIVE

## 2018-08-21 MED ORDER — INSULIN ASPART 100 UNIT/ML ~~LOC~~ SOLN
0.0000 [IU] | Freq: Every day | SUBCUTANEOUS | Status: DC
  Administered 2018-08-22: 5 [IU] via SUBCUTANEOUS
  Administered 2018-08-23: 4 [IU] via SUBCUTANEOUS
  Administered 2018-08-24: 5 [IU] via SUBCUTANEOUS
  Administered 2018-08-25: 3 [IU] via SUBCUTANEOUS
  Administered 2018-08-26: 4 [IU] via SUBCUTANEOUS
  Filled 2018-08-21 (×6): qty 1

## 2018-08-21 MED ORDER — ONDANSETRON HCL 4 MG/2ML IJ SOLN
4.0000 mg | Freq: Once | INTRAMUSCULAR | Status: AC
  Administered 2018-08-21: 13:00:00 4 mg via INTRAVENOUS
  Filled 2018-08-21: qty 2

## 2018-08-21 MED ORDER — POLYETHYLENE GLYCOL 3350 17 G PO PACK: 17.0000 g | PACK | Freq: Every day | ORAL | Status: DC | PRN

## 2018-08-21 MED ORDER — INSULIN ASPART 100 UNIT/ML ~~LOC~~ SOLN
5.0000 [IU] | Freq: Once | SUBCUTANEOUS | Status: AC
  Administered 2018-08-21: 5 [IU] via SUBCUTANEOUS
  Filled 2018-08-21: qty 1

## 2018-08-21 MED ORDER — INSULIN ASPART 100 UNIT/ML ~~LOC~~ SOLN: 20.0000 [IU] | Freq: Once | SUBCUTANEOUS | Status: DC

## 2018-08-21 MED ORDER — ENOXAPARIN SODIUM 40 MG/0.4ML ~~LOC~~ SOLN
40.0000 mg | SUBCUTANEOUS | Status: DC
  Administered 2018-08-21 – 2018-08-22 (×2): 40 mg via SUBCUTANEOUS
  Filled 2018-08-21 (×2): qty 0.4

## 2018-08-21 MED ORDER — MORPHINE SULFATE (PF) 4 MG/ML IV SOLN
INTRAVENOUS | Status: AC
  Filled 2018-08-21: qty 1

## 2018-08-21 MED ORDER — PIPERACILLIN-TAZOBACTAM 3.375 G IVPB 30 MIN
3.3750 g | Freq: Once | INTRAVENOUS | Status: AC
  Administered 2018-08-21: 3.375 g via INTRAVENOUS
  Filled 2018-08-21: qty 50

## 2018-08-21 MED ORDER — SODIUM CHLORIDE 0.9 % IV BOLUS
1000.0000 mL | Freq: Once | INTRAVENOUS | Status: AC
  Administered 2018-08-21: 1000 mL via INTRAVENOUS

## 2018-08-21 MED ORDER — VANCOMYCIN HCL 1.5 G IV SOLR
1500.0000 mg | INTRAVENOUS | Status: DC
  Administered 2018-08-22: 1500 mg via INTRAVENOUS
  Filled 2018-08-21: qty 1500

## 2018-08-21 MED ORDER — INSULIN ASPART 100 UNIT/ML ~~LOC~~ SOLN
10.0000 [IU] | Freq: Once | SUBCUTANEOUS | Status: AC
  Administered 2018-08-21: 21:00:00 10 [IU] via INTRAVENOUS
  Filled 2018-08-21: qty 1

## 2018-08-21 MED ORDER — ACETAMINOPHEN 650 MG RE SUPP: 650.0000 mg | Freq: Four times a day (QID) | RECTAL | Status: DC | PRN

## 2018-08-21 MED ORDER — INSULIN ASPART 100 UNIT/ML ~~LOC~~ SOLN
10.0000 [IU] | Freq: Once | SUBCUTANEOUS | Status: AC
  Administered 2018-08-21: 10 [IU] via INTRAVENOUS
  Filled 2018-08-21: qty 1

## 2018-08-21 MED ORDER — HYDROCODONE-ACETAMINOPHEN 7.5-325 MG PO TABS
1.0000 | ORAL_TABLET | ORAL | Status: AC
  Administered 2018-08-21: 1 via ORAL
  Filled 2018-08-21: qty 1

## 2018-08-21 MED ORDER — MORPHINE SULFATE (PF) 4 MG/ML IV SOLN
4.0000 mg | Freq: Once | INTRAVENOUS | Status: AC
  Administered 2018-08-21: 4 mg via INTRAVENOUS
  Filled 2018-08-21: qty 1

## 2018-08-21 MED ORDER — ATORVASTATIN CALCIUM 20 MG PO TABS
80.0000 mg | ORAL_TABLET | Freq: Every day | ORAL | Status: DC
  Administered 2018-08-22 – 2018-08-26 (×5): 80 mg via ORAL
  Filled 2018-08-21 (×5): qty 4

## 2018-08-21 MED ORDER — LISINOPRIL 10 MG PO TABS: 10.0000 mg | ORAL_TABLET | Freq: Every day | ORAL | Status: DC

## 2018-08-21 MED ORDER — ONDANSETRON HCL 4 MG PO TABS: 4.0000 mg | ORAL_TABLET | Freq: Four times a day (QID) | ORAL | Status: DC | PRN

## 2018-08-21 MED ORDER — ONDANSETRON HCL 4 MG/2ML IJ SOLN
4.0000 mg | Freq: Four times a day (QID) | INTRAMUSCULAR | Status: DC | PRN
  Administered 2018-08-25: 4 mg via INTRAVENOUS

## 2018-08-21 MED ORDER — INSULIN ASPART 100 UNIT/ML ~~LOC~~ SOLN
0.0000 [IU] | Freq: Three times a day (TID) | SUBCUTANEOUS | Status: DC
  Administered 2018-08-21: 20 [IU] via SUBCUTANEOUS
  Administered 2018-08-22: 11 [IU] via SUBCUTANEOUS
  Administered 2018-08-22: 20 [IU] via SUBCUTANEOUS
  Administered 2018-08-22 – 2018-08-23 (×2): 11 [IU] via SUBCUTANEOUS
  Administered 2018-08-23: 4 [IU] via SUBCUTANEOUS
  Administered 2018-08-23: 7 [IU] via SUBCUTANEOUS
  Administered 2018-08-24: 4 [IU] via SUBCUTANEOUS
  Administered 2018-08-24: 3 [IU] via SUBCUTANEOUS
  Administered 2018-08-24: 15 [IU] via SUBCUTANEOUS
  Administered 2018-08-25: 11 [IU] via SUBCUTANEOUS
  Administered 2018-08-26: 7 [IU] via SUBCUTANEOUS
  Administered 2018-08-26: 11 [IU] via SUBCUTANEOUS
  Administered 2018-08-26: 7 [IU] via SUBCUTANEOUS
  Filled 2018-08-21 (×14): qty 1

## 2018-08-21 MED ORDER — INSULIN GLARGINE 100 UNIT/ML ~~LOC~~ SOLN
26.0000 [IU] | Freq: Every day | SUBCUTANEOUS | Status: DC
  Administered 2018-08-21 – 2018-08-22 (×2): 26 [IU] via SUBCUTANEOUS
  Filled 2018-08-21 (×2): qty 0.26

## 2018-08-21 MED ORDER — INSULIN ASPART 100 UNIT/ML ~~LOC~~ SOLN
15.0000 [IU] | Freq: Once | SUBCUTANEOUS | Status: AC
  Administered 2018-08-21: 15 [IU] via SUBCUTANEOUS
  Filled 2018-08-21: qty 1

## 2018-08-21 MED ORDER — SODIUM CHLORIDE 0.9 % IV SOLN
3.0000 g | Freq: Four times a day (QID) | INTRAVENOUS | Status: DC
  Administered 2018-08-21 – 2018-08-24 (×11): 3 g via INTRAVENOUS
  Filled 2018-08-21 (×14): qty 3

## 2018-08-21 MED ORDER — SODIUM CHLORIDE 0.9 % IV SOLN
INTRAVENOUS | Status: AC
  Administered 2018-08-21 – 2018-08-22 (×3): via INTRAVENOUS

## 2018-08-21 MED ORDER — VANCOMYCIN HCL 10 G IV SOLR
2500.0000 mg | Freq: Once | INTRAVENOUS | Status: AC
  Administered 2018-08-21: 2500 mg via INTRAVENOUS
  Filled 2018-08-21: qty 2500

## 2018-08-21 MED ORDER — ACETAMINOPHEN 325 MG PO TABS
650.0000 mg | ORAL_TABLET | Freq: Four times a day (QID) | ORAL | Status: DC | PRN
  Administered 2018-08-22 – 2018-08-23 (×3): 650 mg via ORAL
  Filled 2018-08-21 (×3): qty 2

## 2018-08-21 NOTE — Progress Notes (Signed)
PHARMACIST - PHYSICIAN COMMUNICATION  CONCERNING:  Enoxaparin (Lovenox) for DVT Prophylaxis    RECOMMENDATION: Patient was prescribed enoxaprin 30mg  q24 hours for VTE prophylaxis.   Filed Weights   08/21/18 1228  Weight: 290 lb (131.5 kg)    Body mass index is 39.33 kg/m.  Estimated Creatinine Clearance: 65.5 mL/min (A) (by C-G formula based on SCr of 2.04 mg/dL (H)).   Based on [facility] policy patient is candidate for enoxaparin 40mg  every 24 hour dosing due to estimated creatinine clearance being > 48mL/min.  DESCRIPTION: Pharmacy has adjusted enoxaparin dose per [facility] policy.  Patient is now receiving enoxaparin 40mg  every 24 hours.   Shiron Whetsel L 08/21/2018 9:41 PM

## 2018-08-21 NOTE — ED Triage Notes (Signed)
Pt presents to ED via POV with c/o abscess to R going area and RLE edema. Pt's s/o states affects ability to walk due to pain. Pt's s/o also reports edema to RLE x 1.5 weeks.

## 2018-08-21 NOTE — ED Notes (Signed)
Pt's O2 drops during sleep to 85-88. Dr. Elpidio Anis informed, ok to monitor

## 2018-08-21 NOTE — ED Notes (Signed)
Patient in bed. RA sats laibile, dropping at times into hight 70's with patient sleeping.  Skin warm and dry.  No SOB/ DOe.  NAD.  Dr. Elpidio Anis alerted.  Patient placed on 2l/ [place] and moved pulse ox probe to right hand (not using that hand for frequent cell phone calls).  Sats 96-98%.  Continue to monitor.

## 2018-08-21 NOTE — ED Provider Notes (Signed)
[facility] Emergency Department Provider Note  ____________________________________________   None    (approximate)  I have reviewed the triage vital signs and the nursing notes.   HISTORY  Chief Complaint Abscess and Leg Swelling    HPI Micheal FerraraLeonard R Brose Jr. is a 43 y.o. male presents emergency department complaining of an abscess to the groin area.  States the area has been there for several days.  Is very painful.  Is also complained of right lower extremity pain in the calf.  He states the swelling in the pelvic area has caused his penis to become smaller.  He denies fever chills.  Denies chest pain or shortness of breath.    Past Medical History:  Diagnosis Date   Atypical chest pain    a. 08/2015, neg troponin;  b. 08/2015 Echo: EF 50-55%, no rwma, nl RV fxn.   Diabetes mellitus without complication (HCC)    a. 08/2015: uncontrolled, A1c 13.4   Essential hypertension    Hyperlipidemia    Morbid obesity (HCC)    Stroke [facility](HCC)     Patient Active Problem List   Diagnosis Date Noted   Morbid obesity with BMI of 45.0-49.9, adult (HCC) 03/09/2016   Current tobacco use 03/08/2016   Stage 2 chronic kidney disease 03/08/2016   Stroke, lacunar (HCC) 03/07/2016   Morbid obesity (HCC)    Atypical chest pain 09/01/2015   Dyslipidemia 09/01/2015   Uncontrolled type 2 diabetes mellitus (HCC) 09/01/2015   Pain in the chest    AKI (acute kidney injury) (HCC) 08/31/2015   Hyperglycemia, unspecified 12/14/2014   Encounter for long-term (current) use of medications 02/17/2014   Essential hypertension 02/17/2014   Hyperlipidemia, unspecified 01/25/2010   Type 2 diabetes mellitus without complication (HCC) 07/22/2009    Past Surgical History:  Procedure Laterality Date   HAND SURGERY      Prior to Admission medications   Medication Sig Start Date End Date Taking? Authorizing Provider  atorvastatin (LIPITOR) 80 MG tablet Take 80  mg by mouth daily at 6 PM.  03/16/16  Yes [provider]  insulin NPH-regular Human (NOVOLIN 70/30) (70-30) 100 UNIT/ML injection Inject 18 Units into the skin 2 (two) times daily with a meal. 09/01/15  Yes Sudini, Srikar, MD  insulin regular (NOVOLIN R,HUMULIN R) 100 units/mL injection Inject 48 Units into the skin at bedtime.  03/16/16  Yes [provider]  lisinopril (PRINIVIL,ZESTRIL) 10 MG tablet Take 10 mg by mouth. 03/16/16  Yes [provider]  lovastatin (MEVACOR) 40 MG tablet Take 1 tablet (40 mg total) by mouth at bedtime. 09/01/15  Yes Sudini, Wardell HeathSrikar, MD  metFORMIN (GLUCOPHAGE) 1000 MG tablet Take 1,000 mg by mouth 2 (two) times a day.  03/16/16  Yes [provider]  ammonium lactate (LAC-HYDRIN) 12 % cream Apply topically as needed for dry skin. Patient not taking: Reported on 08/21/2018 01/24/18   Helane GuntherMayer, Gregory, DPM    Allergies Cheese  Family History  Problem Relation Age of Onset   Diabetes Mellitus II Maternal Grandmother    Diabetes Mellitus II Mother    Hypertension Mother    Diabetes Mellitus II Father    Hypertension Father     Social History Social History   Tobacco Use   Smoking status: Former Smoker    Types: Cigars    Last attempt to quit: 03/05/2016    Years since quitting: 2.4   Smokeless tobacco: Never Used  Substance Use Topics   Alcohol use: No  Alcohol/week: 0.0 standard drinks   Drug use: No    Types: Marijuana, Cocaine    Comment: last 1 month ago    Review of Systems  Constitutional: No fever/chills Eyes: No visual changes. ENT: No sore throat. Respiratory: Denies cough Gastrointestinal: Positive for swelling at the groin area/abscess/questionable hernia Genitourinary: Negative for dysuria. Musculoskeletal: Negative for back pain.  Right lower extremity pain Skin: Negative for rash.    ____________________________________________   PHYSICAL EXAM:  VITAL SIGNS: ED Triage Vitals  Enc  Vitals Group     BP 08/21/18 1228 113/70     Pulse Rate 08/21/18 1228 98     Resp 08/21/18 1228 18     Temp 08/21/18 1228 98.6 F (37 C)     Temp Source 08/21/18 1228 Oral     SpO2 08/21/18 1228 94 %     Weight 08/21/18 1228 290 lb (131.5 kg)     Height 08/21/18 1228 6' (1.829 m)     Head Circumference --      Peak Flow --      Pain Score 08/21/18 1216 10     Pain Loc --      Pain Edu? --      Excl. in GC? --     Constitutional: Alert and oriented. Well appearing and in no acute distress. Eyes: Conjunctivae are normal.  Head: Atraumatic. Nose: No congestion/rhinnorhea. Mouth/Throat: Mucous membranes are moist.   Neck:  supple no lymphadenopathy noted Cardiovascular: Normal rate, regular rhythm. Heart sounds are normal Respiratory: Normal respiratory effort.  No retractions, lungs c t a  Abd: soft nontender bs normal all 4 quad, pubis area is very tender and swollen, testicles are nontender until the inguinal canal is tested, GU: deferred Musculoskeletal: FROM all extremities, warm and well perfused, right lower extremity is tender to palpation Neurologic:  Normal speech and language.  Skin:  Skin is warm, dry and intact. No rash noted.  Questionable abscess versus hernia at the pubis area.  Psychiatric: Mood and affect are normal. Speech and behavior are normal.  ____________________________________________   LABS (all labs ordered are listed, but only abnormal results are displayed)  Labs Reviewed  COMPREHENSIVE METABOLIC PANEL - Abnormal; Notable for the following components:      Result Value   Sodium 129 (*)    Chloride 93 (*)    Glucose, Bld 661 (*)    BUN 24 (*)    Creatinine, Ser 2.04 (*)    Calcium 7.8 (*)    Albumin 2.6 (*)    GFR calc non Af Amer 39 (*)    GFR calc Af Amer 45 (*)    All other components within normal limits  CBC WITH DIFFERENTIAL/PLATELET - Abnormal; Notable for the following components:   WBC 10.9 (*)    Neutro Abs 8.1 (*)    Monocytes  Absolute 1.1 (*)    Abs Immature Granulocytes 0.08 (*)    All other components within normal limits  CBG MONITORING, ED   ____________________________________________   ____________________________________________  RADIOLOGY  Ultrasound right lower extremity is negative for DVT CT abdomen/pelvis shows a large amount of cellulitis that is that the lower abdominal/pubis that extends to the upper scrotum  ____________________________________________   PROCEDURES  Procedure(s) performed: Saline lock, morphine 4 mg IV, Zofran 4 mg IV   Procedures    ____________________________________________   INITIAL IMPRESSION / ASSESSMENT AND PLAN / ED COURSE  Pertinent labs & imaging results that were available during my care of the  patient were reviewed by me and considered in my medical decision making (see chart for details).   Patient is 43 year old male presents emergency department complaining of abscess to the groin area along with right lower extremity pain.  He is concerned he has blood clot in the right lower leg.  States abscess very painful.  States it is made his testicles and scrotum swell until his penis is shortened.  Physical exam patient does appear well.  The pubis area is tender and swollen, when I touch the inguinal canal patient is very uncomfortable.  Right lower extremity is also tender along the calf.  CBC, metabolic panel, CT abdomen/pelvis and ultrasound right of lower extremity ordered   ddx: Hernia, abscess, DVT  ----------------------------------------- 2:13 PM on 08/21/2018 ----------------------------------------- CBC with elevated WBC at 10.9, comprehensive metabolic panel with critical value of glucose is 661, BUN is 24 and creatinine is 2.04, both of these values were discussed with Dr. Erma Heritage, fluid, insulin, and CBG monitoring were ordered.  Ultrasound of the right lower extremity for DVT due to the swelling in the lower extremity along with pain is  negative for DVT  We changed the CT of the pelvis to noncontrast because of the kidney function. ----------------------------------------- 3:31 PM on 08/21/2018 -----------------------------------------  Dr. Juanetta Beets see the patient.  Recommends admission.  Patient's glucose is 661 along with a large amount of cellulitis and concerns that he needs IV antibiotics due to the elevated WBC.  CT showed a large amount of cellulitis that extended into the upper scrotum.  The patient is aware of the need for admission. Hospitalist paged  ----------------------------------------- 3:45 PM on 08/21/2018 -----------------------------------------  Dr. Elpidio Anis aware that patient needs to be admitted.  Report was given to him.  Patient will be admitted in his care. As part of my medical decision making, I reviewed the following data within the electronic MEDICAL RECORD NUMBER Nursing notes reviewed and incorporated, Labs reviewed see above, Old chart reviewed, Radiograph reviewed CT abdomen/pelvis shows large amount of cellulitis extending to the upper scrotum, Discussed with admitting physician Dr. Elpidio Anis, Evaluated by EM attending Dr. Erma Heritage, Notes from prior ED visits and [place] Controlled Substance Database  ____________________________________________   FINAL CLINICAL IMPRESSION(S) / ED DIAGNOSES  Final diagnoses:  Cellulitis of abdominal wall  Hyperglycemia      NEW MEDICATIONS STARTED DURING THIS VISIT:  New Prescriptions   No medications on file     Note:  This document was prepared using Dragon voice recognition software and may include unintentional dictation errors.    Faythe Ghee, PA-C 08/21/18 1547    Shaune Pollack, MD 08/21/18 2136

## 2018-08-21 NOTE — H&P (Addendum)
[facility] - [place] [facility]   PATIENT NAME: Micheal Robinson    MR#:  [id number]  DATE OF BIRTH:  04-12-1975  DATE OF ADMISSION:  08/21/2018  PRIMARY CARE PHYSICIAN: [facility]   REQUESTING/REFERRING PHYSICIAN: Dr. Mayford KnifeWilliams  CHIEF COMPLAINT:   Chief Complaint  Patient presents with  . Abscess  . Leg Swelling    HISTORY OF PRESENT ILLNESS:  Micheal CordiaLeonard Treese  is a 43 y.o. male with a known history of hypertension, diabetes mellitus, morbid obesity who has not been taking his insulin for close 20-year presents to the emergency room complaining of swelling, redness and pain in his right groin area.  Started out as a small boil and acutely worsened.  Here patient was found to have cellulitis of the right groin area.  CT scan of the abdomen and pelvis showed no air or fluid.  Patient has been given a dose of IV Zosyn and 10 units of IV insulin. He was on insulin 70/30 mix 18 units twice a day in the past.  PAST MEDICAL HISTORY:   Past Medical History:  Diagnosis Date  . Atypical chest pain    a. 08/2015, neg troponin;  b. 08/2015 Echo: EF 50-55%, no rwma, nl RV fxn.  . Diabetes mellitus without complication (HCC)    a. 08/2015: uncontrolled, A1c 13.4  . Essential hypertension   . Hyperlipidemia   . Morbid obesity (HCC)   . Stroke [facility](HCC)     PAST SURGICAL HISTORY:   Past Surgical History:  Procedure Laterality Date  . HAND SURGERY      SOCIAL HISTORY:   Social History   Tobacco Use  . Smoking status: Former Smoker    Types: Cigars    Last attempt to quit: 03/05/2016    Years since quitting: 2.4  . Smokeless tobacco: Never Used  Substance Use Topics  . Alcohol use: No    Alcohol/week: 0.0 standard drinks    FAMILY HISTORY:   Family History  Problem Relation Age of Onset  . Diabetes Mellitus II Maternal Grandmother   . Diabetes Mellitus II Mother   . Hypertension Mother   . Diabetes Mellitus II Father   .  Hypertension Father     DRUG ALLERGIES:   Allergies  Allergen Reactions  . Cheese Hives and Swelling    REVIEW OF SYSTEMS:   Review of Systems  Constitutional: Positive for chills and malaise/fatigue. Negative for fever and weight loss.  HENT: Negative for hearing loss and nosebleeds.   Eyes: Negative for blurred vision, double vision and pain.  Respiratory: Negative for cough, hemoptysis, sputum production, shortness of breath and wheezing.   Cardiovascular: Negative for chest pain, palpitations, orthopnea and leg swelling.  Gastrointestinal: Negative for abdominal pain, constipation, diarrhea, nausea and vomiting.  Genitourinary: Negative for dysuria and hematuria.  Musculoskeletal: Negative for back pain, falls and myalgias.  Skin: Negative for rash.  Neurological: Negative for dizziness, tremors, sensory change, speech change, focal weakness, seizures and headaches.  Endo/Heme/Allergies: Does not bruise/bleed easily.  Psychiatric/Behavioral: Negative for depression and memory loss. The patient is not nervous/anxious.     MEDICATIONS AT HOME:   Prior to Admission medications   Medication Sig Start Date End Date Taking? Authorizing Provider  atorvastatin (LIPITOR) 80 MG tablet Take 80 mg by mouth daily at 6 PM.  03/16/16  Yes [provider]  insulin NPH-regular Human (NOVOLIN 70/30) (70-30) 100 UNIT/ML injection Inject 18 Units into the skin 2 (two) times daily  with a meal. 09/01/15  Yes Jaeanna Mccomber, Wardell Heath, MD  insulin regular (NOVOLIN R,HUMULIN R) 100 units/mL injection Inject 48 Units into the skin at bedtime.  03/16/16  Yes [provider]  lisinopril (PRINIVIL,ZESTRIL) 10 MG tablet Take 10 mg by mouth. 03/16/16  Yes [provider]  lovastatin (MEVACOR) 40 MG tablet Take 1 tablet (40 mg total) by mouth at bedtime. 09/01/15  Yes Davanna He, Wardell Heath, MD  metFORMIN (GLUCOPHAGE) 1000 MG tablet Take 1,000 mg by mouth 2 (two) times a day.  03/16/16  Yes [provider]  ammonium lactate (LAC-HYDRIN) 12 % cream Apply topically as needed for dry skin. Patient not taking: Reported on 08/21/2018 01/24/18   Helane Gunther, DPM     VITAL SIGNS:  Blood pressure 124/73, pulse 91, temperature 98.6 F (37 C), temperature source Oral, resp. rate 18, height 6' (1.829 m), weight 131.5 kg, SpO2 96 %.  PHYSICAL EXAMINATION:  Physical Exam  GENERAL:  43 y.o.-year-old patient lying in the bed with no acute distress.  EYES: Pupils equal, round, reactive to light and accommodation. No scleral icterus. Extraocular muscles intact.  HEENT: Head atraumatic, normocephalic. Oropharynx and nasopharynx clear. No oropharyngeal erythema, moist oral mucosa  NECK:  Supple, no jugular venous distention. No thyroid enlargement, no tenderness.  LUNGS: Normal breath sounds bilaterally, no wheezing, rales, rhonchi. No use of accessory muscles of respiration.  CARDIOVASCULAR: S1, S2 normal. No murmurs, rubs, or gallops.  ABDOMEN: Soft, nontender, nondistended. Bowel sounds present. No organomegaly or mass.  EXTREMITIES: No pedal edema, cyanosis, or clubbing. + 2 pedal & radial pulses b/l.   NEUROLOGIC: Cranial nerves II through XII are intact. No focal Motor or sensory deficits appreciated b/l PSYCHIATRIC: The patient is alert and oriented x 3. Good affect.  SKIN: Right groin redness, swelling.  No discharge.  Extends to the right side of the scrotum.  LABORATORY PANEL:   CBC Recent Labs  Lab 08/21/18 1253  WBC 10.9*  HGB 14.5  HCT 42.8  PLT 203   ------------------------------------------------------------------------------------------------------------------  Chemistries  Recent Labs  Lab 08/21/18 1253  NA 129*  K 4.1  CL 93*  CO2 23  GLUCOSE 661*  BUN 24*  CREATININE 2.04*  CALCIUM 7.8*  AST 19  ALT 22  ALKPHOS 71  BILITOT 0.6    ------------------------------------------------------------------------------------------------------------------  Cardiac Enzymes No results for input(s): TROPONINI in the last 168 hours. ------------------------------------------------------------------------------------------------------------------  RADIOLOGY:  Ct Abdomen Pelvis Wo Contrast  Result Date: 08/21/2018 CLINICAL DATA:  Abdominal pain and fever. Nausea and vomiting. Right inguinal region swelling. EXAM: CT ABDOMEN AND PELVIS WITHOUT CONTRAST TECHNIQUE: Multidetector CT imaging of the abdomen and pelvis was performed following the standard protocol without oral or IV contrast. COMPARISON:  None. FINDINGS: Lower chest: There is atelectatic change in the lung bases. No lung base edema or consolidation is evident. Hepatobiliary: No focal liver lesions are appreciable on this noncontrast enhanced study. The gallbladder wall is not appreciably thickened. There is no biliary duct dilatation. Pancreas: There is no evident pancreatic mass or inflammatory focus. Spleen: No splenic lesions are evident. Adrenals/Urinary Tract: Adrenals bilaterally appear unremarkable. Kidneys bilaterally show no evident mass or hydronephrosis on either side. There is no evident renal or ureteral calculus on either side. Urinary bladder is midline with wall thickness within normal limits. Stomach/Bowel: There is no appreciable bowel wall or mesenteric thickening. Terminal ileum appears normal. There is no bowel obstruction. There is no evident free air or portal venous air. Vascular/Lymphatic: There is no abdominal aortic aneurysm.  No vascular lesions are evident. Reproductive: There are prominent inguinal lymph nodes in the medial lower pelvis and upper thigh region with surrounding soft tissue stranding. The largest lymph node in this area measures 2.4 x 1.5 cm. There is subcutaneous thickening of the lower pelvic/upper thigh region wall in the anterior right lower  inguinal region. There is no frank fluid collection or abscess. No abnormal air in this area. No bowel containing hernia in this area. Elsewhere, there are several borderline prominent inguinal lymph nodes which have central fatty hila, suggesting benign etiology. There are a few subcentimeter common femoral artery lymph nodes which do not meet size criteria for pathologic significance. No adenopathy based on size criteria noted within the peritoneum or retroperitoneum of the abdomen and pelvis. Other: Appendix appears normal. No abscess or ascites evident in the abdomen or pelvis. Musculoskeletal: No blastic or lytic bone lesions. No intramuscular lesions are evident. IMPRESSION: 1. Localized adenopathy and soft tissue stranding with subcutaneous thickening in the medial right lower inguinal and proximal thigh region extending to the upper scrotal sac. No associated fluid or frank abscess. No air in this area. There is inflammatory change which may be due to a combination of adenitis and panniculitis. No inflammatory change of this nature seen elsewhere on this study 2. No evident bowel obstruction. No abscess in the abdomen or pelvis. Appendix appears normal. No evident hernia. 3. No evident renal or ureteral calculus. No hydronephrosis. Urinary bladder wall thickness is normal. Electronically Signed   By: Bretta Bang III M.D.   On: 08/21/2018 14:37   US Venous Img Lower Unilateral Right  Result Date: 08/21/2018 CLINICAL DATA:  Right lower extremity pain and edema for the past 2 weeks. History of smoking. Evaluate for DVT. EXAM: RIGHT LOWER EXTREMITY VENOUS DOPPLER ULTRASOUND TECHNIQUE: Gray-scale sonography with graded compression, as well as color Doppler and duplex ultrasound were performed to evaluate the lower extremity deep venous systems from the level of the common femoral vein and including the common femoral, femoral, profunda femoral, popliteal and calf veins including the posterior tibial,  peroneal and gastrocnemius veins when visible. The superficial great saphenous vein was also interrogated. Spectral Doppler was utilized to evaluate flow at rest and with distal augmentation maneuvers in the common femoral, femoral and popliteal veins. COMPARISON:  None. FINDINGS: Contralateral Common Femoral Vein: Respiratory phasicity is normal and symmetric with the symptomatic side. No evidence of thrombus. Normal compressibility. Common Femoral Vein: No evidence of thrombus. Normal compressibility, respiratory phasicity and response to augmentation. Saphenofemoral Junction: No evidence of thrombus. Normal compressibility and flow on color Doppler imaging. Profunda Femoral Vein: No evidence of thrombus. Normal compressibility and flow on color Doppler imaging. Femoral Vein: No evidence of thrombus. Normal compressibility, respiratory phasicity and response to augmentation. Popliteal Vein: No evidence of thrombus. Normal compressibility, respiratory phasicity and response to augmentation. Calf Veins: No evidence of thrombus. Normal compressibility and flow on color Doppler imaging. Superficial Great Saphenous Vein: No evidence of thrombus. Normal compressibility. Venous Reflux:  None. Other Findings:  None. IMPRESSION: No evidence of DVT within the right lower extremity. Electronically Signed   By: Simonne Come M.D.   On: 08/21/2018 14:00     IMPRESSION AND PLAN:   *Severe hyperglycemia.  No DKA.  High risk for DKA with ongoing infection and uncontrolled diabetes mellitus.  We will bolus normal saline stat.  Give stat dose of Lantus 26 units and start NovoLog sliding scale.  Diabetic diet.  Will need prescriptions for insulin  at discharge.  Check hemoglobin A1c.  *Right groin cellulitis with no air or fluid on the CT scan.  Will be admitted with IV vancomycin and Unasyn.  No open wounds found.  Will check lactic acid.  *Acute kidney injury over CKD stage III.  IV fluid bolus and maintenance fluids.   Monitor input and output.  Will check a CK level.  *Hypertension.  Continue lisinopril 10 mg once creatinine improves.  *DVT prophylaxis with renally dosed Lovenox.  All the records are reviewed and case discussed with ED provider. Management plans discussed with the patient, family and they are in agreement.  CODE STATUS: Full code  TOTAL CC TIME TAKING CARE OF THIS PATIENT: 80 minutes.   Molinda Bailiff Navon Kotowski M.D on 08/21/2018 at 4:40 PM  Between 7am to 6pm - Pager - [phone]  After 6pm go to [email or web address] - password [email or web address] [facility]  [facility]  Office  [phone]  CC: Primary care physician; [facility]  Note: This dictation was prepared with Nurse, children's dictation along with smaller phrase technology. Any transcriptional errors that result from this process are unintentional.

## 2018-08-21 NOTE — ED Notes (Signed)
ED TO INPATIENT HANDOFF REPORT  ED Nurse Name and Phone #:  Lurena Joiner #[phone]  S Name/Age/Gender Micheal Robinson 43 y.o. male Room/Bed: ED18A/ED18A  Code Status   Code Status: Full Code  Home/SNF/Other Home Patient oriented to: self, place, time and situation Is this baseline? Yes   Triage Complete: Triage complete  Chief Complaint abscess rt leg pain  Triage Note Pt presents to ED via POV with c/o abscess to R going area and RLE edema. Pt's s/o states affects ability to walk due to pain. Pt's s/o also reports edema to RLE x 1.5 weeks.    Allergies Allergies  Allergen Reactions  . Cheese Hives and Swelling    Level of Care/Admitting Diagnosis ED Disposition    ED Disposition Condition Comment   Admit  Hospital Area: [facility] [[id number]]  Level of Care: Med-Surg [16]  Covid Evaluation: N/A  Diagnosis: Cellulitis [[id number]]  Admitting Physician: Milagros Loll [[id number]]  Attending Physician: Milagros Loll [[id number]]  Estimated length of stay: past midnight tomorrow  Certification:: I certify this patient will need inpatient services for at least 2 midnights  PT Class (Do Not Modify): Inpatient [101]  PT Acc Code (Do Not Modify): Private [1]       B Medical/Surgery History Past Medical History:  Diagnosis Date  . Atypical chest pain    a. 08/2015, neg troponin;  b. 08/2015 Echo: EF 50-55%, no rwma, nl RV fxn.  . Diabetes mellitus without complication (HCC)    a. 08/2015: uncontrolled, A1c 13.4  . Essential hypertension   . Hyperlipidemia   . Morbid obesity (HCC)   . Stroke [facility])    Past Surgical History:  Procedure Laterality Date  . HAND SURGERY       A IV Location/Drains/Wounds Patient Lines/Drains/Airways Status   Active Line/Drains/Airways    Name:   Placement date:   Placement time:   Site:   Days:   Peripheral IV 08/21/18 Left Antecubital   08/21/18    1246    Antecubital   less than 1          Intake/Output Last 24  hours  Intake/Output Summary (Last 24 hours) at 08/21/2018 2028 Last data filed at 08/21/2018 1543 Gross per 24 hour  Intake 50 ml  Output -  Net 50 ml    Labs/Imaging Results for orders placed or performed during the hospital encounter of 08/21/18 (from the past 48 hour(s))  Comprehensive metabolic panel     Status: Abnormal   Collection Time: 08/21/18 12:53 PM  Result Value Ref Range   Sodium 129 (L) 135 - 145 mmol/L   Potassium 4.1 3.5 - 5.1 mmol/L   Chloride 93 (L) 98 - 111 mmol/L   CO2 23 22 - 32 mmol/L   Glucose, Bld 661 (HH) 70 - 99 mg/dL    Comment: CRITICAL RESULT CALLED TO, READ BACK BY AND VERIFIED WITH  JESSICA FULCHER AT 1316 08/21/2018 SDR    BUN 24 (H) 6 - 20 mg/dL   Creatinine, Ser 1.30 (H) 0.61 - 1.24 mg/dL   Calcium 7.8 (L) 8.9 - 10.3 mg/dL   Total Protein 7.2 6.5 - 8.1 g/dL   Albumin 2.6 (L) 3.5 - 5.0 g/dL   AST 19 15 - 41 U/L   ALT 22 0 - 44 U/L   Alkaline Phosphatase 71 38 - 126 U/L   Total Bilirubin 0.6 0.3 - 1.2 mg/dL   GFR calc non Af Amer 39 (L) >60 mL/min   GFR  calc Af Amer 45 (L) >60 mL/min   Anion gap 13 5 - 15    Comment: Performed at [facility], [address] [place], [place] [address]  CBC with Differential     Status: Abnormal   Collection Time: 08/21/18 12:53 PM  Result Value Ref Range   WBC 10.9 (H) 4.0 - 10.5 K/uL   RBC 5.25 4.22 - 5.81 MIL/uL   Hemoglobin 14.5 13.0 - 17.0 g/dL   HCT 42.6 83.4 - 19.6 %   MCV 81.5 80.0 - 100.0 fL   MCH 27.6 26.0 - 34.0 pg   MCHC 33.9 30.0 - 36.0 g/dL   RDW 22.2 97.9 - 89.2 %   Platelets 203 150 - 400 K/uL   nRBC 0.0 0.0 - 0.2 %   Neutrophils Relative % 75 %   Neutro Abs 8.1 (H) 1.7 - 7.7 K/uL   Lymphocytes Relative 11 %   Lymphs Abs 1.2 0.7 - 4.0 K/uL   Monocytes Relative 10 %   Monocytes Absolute 1.1 (H) 0.1 - 1.0 K/uL   Eosinophils Relative 3 %   Eosinophils Absolute 0.3 0.0 - 0.5 K/uL   Basophils Relative 0 %   Basophils Absolute 0.0 0.0 - 0.1 K/uL   Immature Granulocytes 1 %    Abs Immature Granulocytes 0.08 (H) 0.00 - 0.07 K/uL    Comment: Performed at [facility], [address] [place], [place] [address]  CK     Status: None   Collection Time: 08/21/18 12:53 PM  Result Value Ref Range   Total CK 277 49 - 397 U/L    Comment: Performed at [facility], [address] [place], [place] [address]  SARS Coronavirus 2 (CEPHEID - Performed in [facility] hospital lab), Hosp Order     Status: None   Collection Time: 08/21/18  3:54 PM  Result Value Ref Range   SARS Coronavirus 2 NEGATIVE NEGATIVE    Comment: (NOTE) If result is NEGATIVE SARS-CoV-2 target nucleic acids are NOT DETECTED. The SARS-CoV-2 RNA is generally detectable in upper and lower  respiratory specimens during the acute phase of infection. The lowest  concentration of SARS-CoV-2 viral copies this assay can detect is 250  copies / mL. A negative result does not preclude SARS-CoV-2 infection  and should not be used as the sole basis for treatment or other  patient management decisions.  A negative result may occur with  improper specimen collection / handling, submission of specimen other  than nasopharyngeal swab, presence of viral mutation(s) within the  areas targeted by this assay, and inadequate number of viral copies  (<250 copies / mL). A negative result must be combined with clinical  observations, patient history, and epidemiological information. If result is POSITIVE SARS-CoV-2 target nucleic acids are DETECTED. The SARS-CoV-2 RNA is generally detectable in upper and lower  respiratory specimens dur ing the acute phase of infection.  Positive  results are indicative of active infection with SARS-CoV-2.  Clinical  correlation with patient history and other diagnostic information is  necessary to determine patient infection status.  Positive results do  not rule out bacterial infection or co-infection with other viruses. If result is PRESUMPTIVE POSTIVE SARS-CoV-2  nucleic acids MAY BE PRESENT.   A presumptive positive result was obtained on the submitted specimen  and confirmed on repeat testing.  While 2019 novel coronavirus  (SARS-CoV-2) nucleic acids may be present in the submitted sample  additional confirmatory testing may be necessary for epidemiological  and / or clinical management purposes  to differentiate between  SARS-CoV-2 and other Sarbecovirus currently known to infect humans.  If clinically indicated additional testing with an alternate test  methodology [phone]([id number]) is advised. The SARS-CoV-2 RNA is generally  detectable in upper and lower respiratory sp ecimens during the acute  phase of infection. The expected result is Negative. Fact Sheet for Patients:  BoilerBrush.com.cyhttps://www.fda.gov/media/136312/download Fact Sheet for Healthcare Providers: https://pope.com/https://www.fda.gov/media/136313/download This test is not yet approved or cleared by the [place] FDA and has been authorized for detection and/or diagnosis of SARS-CoV-2 by FDA under an Emergency Use Authorization (EUA).  This EUA will remain in effect (meaning this test can be used) for the duration of the COVID-19 declaration under Section 564(b)(1) of the Act, 21 U.S.C. section 360bbb-3(b)(1), unless the authorization is terminated or revoked sooner. Performed at [facility], [address] [place], [place] [address]   Glucose, capillary     Status: Abnormal   Collection Time: 08/21/18  4:51 PM  Result Value Ref Range   Glucose-Capillary 548 (HH) 70 - 99 mg/dL  Glucose, capillary     Status: Abnormal   Collection Time: 08/21/18  7:23 PM  Result Value Ref Range   Glucose-Capillary 582 (HH) 70 - 99 mg/dL   Ct Abdomen Pelvis Wo Contrast  Result Date: 08/21/2018 CLINICAL DATA:  Abdominal pain and fever. Nausea and vomiting. Right inguinal region swelling. EXAM: CT ABDOMEN AND PELVIS WITHOUT CONTRAST TECHNIQUE: Multidetector CT imaging of the abdomen and pelvis was performed  following the standard protocol without oral or IV contrast. COMPARISON:  None. FINDINGS: Lower chest: There is atelectatic change in the lung bases. No lung base edema or consolidation is evident. Hepatobiliary: No focal liver lesions are appreciable on this noncontrast enhanced study. The gallbladder wall is not appreciably thickened. There is no biliary duct dilatation. Pancreas: There is no evident pancreatic mass or inflammatory focus. Spleen: No splenic lesions are evident. Adrenals/Urinary Tract: Adrenals bilaterally appear unremarkable. Kidneys bilaterally show no evident mass or hydronephrosis on either side. There is no evident renal or ureteral calculus on either side. Urinary bladder is midline with wall thickness within normal limits. Stomach/Bowel: There is no appreciable bowel wall or mesenteric thickening. Terminal ileum appears normal. There is no bowel obstruction. There is no evident free air or portal venous air. Vascular/Lymphatic: There is no abdominal aortic aneurysm. No vascular lesions are evident. Reproductive: There are prominent inguinal lymph nodes in the medial lower pelvis and upper thigh region with surrounding soft tissue stranding. The largest lymph node in this area measures 2.4 x 1.5 cm. There is subcutaneous thickening of the lower pelvic/upper thigh region wall in the anterior right lower inguinal region. There is no frank fluid collection or abscess. No abnormal air in this area. No bowel containing hernia in this area. Elsewhere, there are several borderline prominent inguinal lymph nodes which have central fatty hila, suggesting benign etiology. There are a few subcentimeter common femoral artery lymph nodes which do not meet size criteria for pathologic significance. No adenopathy based on size criteria noted within the peritoneum or retroperitoneum of the abdomen and pelvis. Other: Appendix appears normal. No abscess or ascites evident in the abdomen or pelvis.  Musculoskeletal: No blastic or lytic bone lesions. No intramuscular lesions are evident. IMPRESSION: 1. Localized adenopathy and soft tissue stranding with subcutaneous thickening in the medial right lower inguinal and proximal thigh region extending to the upper scrotal sac. No associated fluid or frank abscess. No air in  this area. There is inflammatory change which may be due to a combination of adenitis and panniculitis. No inflammatory change of this nature seen elsewhere on this study 2. No evident bowel obstruction. No abscess in the abdomen or pelvis. Appendix appears normal. No evident hernia. 3. No evident renal or ureteral calculus. No hydronephrosis. Urinary bladder wall thickness is normal. Electronically Signed   By: Bretta Bang III M.D.   On: 08/21/2018 14:37   US Venous Img Lower Unilateral Right  Result Date: 08/21/2018 CLINICAL DATA:  Right lower extremity pain and edema for the past 2 weeks. History of smoking. Evaluate for DVT. EXAM: RIGHT LOWER EXTREMITY VENOUS DOPPLER ULTRASOUND TECHNIQUE: Gray-scale sonography with graded compression, as well as color Doppler and duplex ultrasound were performed to evaluate the lower extremity deep venous systems from the level of the common femoral vein and including the common femoral, femoral, profunda femoral, popliteal and calf veins including the posterior tibial, peroneal and gastrocnemius veins when visible. The superficial great saphenous vein was also interrogated. Spectral Doppler was utilized to evaluate flow at rest and with distal augmentation maneuvers in the common femoral, femoral and popliteal veins. COMPARISON:  None. FINDINGS: Contralateral Common Femoral Vein: Respiratory phasicity is normal and symmetric with the symptomatic side. No evidence of thrombus. Normal compressibility. Common Femoral Vein: No evidence of thrombus. Normal compressibility, respiratory phasicity and response to augmentation. Saphenofemoral Junction: No  evidence of thrombus. Normal compressibility and flow on color Doppler imaging. Profunda Femoral Vein: No evidence of thrombus. Normal compressibility and flow on color Doppler imaging. Femoral Vein: No evidence of thrombus. Normal compressibility, respiratory phasicity and response to augmentation. Popliteal Vein: No evidence of thrombus. Normal compressibility, respiratory phasicity and response to augmentation. Calf Veins: No evidence of thrombus. Normal compressibility and flow on color Doppler imaging. Superficial Great Saphenous Vein: No evidence of thrombus. Normal compressibility. Venous Reflux:  None. Other Findings:  None. IMPRESSION: No evidence of DVT within the right lower extremity. Electronically Signed   By: Simonne Come M.D.   On: 08/21/2018 14:00    Pending Labs Unresulted Labs (From admission, onward)    Start     Ordered   08/28/18 0500  Creatinine, serum  (enoxaparin (LOVENOX)    CrCl < 30 ml/min)  Weekly,   STAT    Comments:  while on enoxaparin therapy.    08/21/18 1631   08/22/18 0500  Basic metabolic panel  Tomorrow morning,   STAT     08/21/18 1631   08/22/18 0500  CBC  Tomorrow morning,   STAT     08/21/18 1631   08/22/18 0500  HIV Antibody (routine testing w rflx)  Once,   R     08/22/18 0500   08/21/18 1643  Lactic acid, plasma  STAT Now then every 3 hours,   STAT     08/21/18 1642   08/21/18 1631  Hemoglobin A1c  Add-on,   AD    Comments:  To assess prior glycemic control    08/21/18 1631          Vitals/Pain Today's Vitals   08/21/18 1730 08/21/18 1826 08/21/18 1826 08/21/18 1927  BP: 135/79   126/76  Pulse: 95  88 88  Resp:      Temp:      TempSrc:      SpO2: 94%  98% 97%  Weight:      Height:      PainSc:  7       Isolation Precautions  No active isolations  Medications Medications  insulin glargine (LANTUS) injection 26 Units (26 Units Subcutaneous Given 08/21/18 1923)  insulin aspart (novoLOG) injection 0-20 Units (20 Units Subcutaneous  Given 08/21/18 1712)  insulin aspart (novoLOG) injection 0-5 Units (has no administration in time range)  enoxaparin (LOVENOX) injection 30 mg (has no administration in time range)  acetaminophen (TYLENOL) tablet 650 mg (has no administration in time range)    Or  acetaminophen (TYLENOL) suppository 650 mg (has no administration in time range)  polyethylene glycol (MIRALAX / GLYCOLAX) packet 17 g (has no administration in time range)  ondansetron (ZOFRAN) tablet 4 mg (has no administration in time range)    Or  ondansetron (ZOFRAN) injection 4 mg (has no administration in time range)  insulin aspart (novoLOG) injection 20 Units (20 Units Subcutaneous Not Given 08/21/18 1824)  vancomycin (VANCOCIN) 1,500 mg in sodium chloride 0.9 % 500 mL IVPB (has no administration in time range)  vancomycin (VANCOCIN) 2,500 mg in sodium chloride 0.9 % 500 mL IVPB (2,500 mg Intravenous New Bag/Given 08/21/18 1854)  Ampicillin-Sulbactam (UNASYN) 3 g in sodium chloride 0.9 % 100 mL IVPB (0 g Intravenous Stopped 08/21/18 1852)  0.9 %  sodium chloride infusion ( Intravenous New Bag/Given 08/21/18 1802)  morphine 4 MG/ML injection 4 mg (4 mg Intravenous Given 08/21/18 1300)  ondansetron (ZOFRAN) injection 4 mg (4 mg Intravenous Given 08/21/18 1300)  sodium chloride 0.9 % bolus 1,000 mL (0 mLs Intravenous Stopped 08/21/18 1800)  insulin aspart (novoLOG) injection 10 Units (10 Units Intravenous Given 08/21/18 1411)  piperacillin-tazobactam (ZOSYN) IVPB 3.375 g (0 g Intravenous Stopped 08/21/18 1543)  sodium chloride 0.9 % bolus 1,000 mL (0 mLs Intravenous Stopped 08/21/18 1800)    Mobility walks Moderate fall risk   Focused Assessments Cardiac Assessment Handoff:    Lab Results  Component Value Date   CKTOTAL 277 08/21/2018   TROPONINI <0.03 03/05/2016   No results found for: DDIMER Does the Patient currently have chest pain? No     R Recommendations: See Admitting Provider Note  Report given to:   Additional Notes:   Patient is on 2L [place] due to O2 sat of 85% while sleeping

## 2018-08-21 NOTE — ED Notes (Signed)
Pt sitting in bed speaking with this RN, A&Ox4, NAD noted.

## 2018-08-21 NOTE — ED Notes (Addendum)
Patient given extra pillow and helped back in bed after getting up to use restroom

## 2018-08-21 NOTE — ED Notes (Signed)
Pt otf for imaging 

## 2018-08-21 NOTE — Progress Notes (Signed)
Notified Janeann Merl of pt's CBG 442 and request for pain medication. New orders received

## 2018-08-21 NOTE — Progress Notes (Signed)
Pharmacy Antibiotic Note  Micheal Robinson. is a 43 y.o. male admitted on 08/21/2018 with cellulitis.  Pharmacy has been consulted for Vancomycin and Unasyn dosing.  Plan: Vancomycin 2500mg  IV loading dose followed by Vancomycin 1500 mg IV Q 24 hrs. Goal AUC 400-550. Expected AUC: 486.1 SCr used: 2.04 Expected Cmin: 11.7  Unasyn 3g IV q6h  Keep close watch on renal function.   Height: 6' (182.9 cm) Weight: 290 lb (131.5 kg) IBW/kg (Calculated) : 77.6  Temp (24hrs), Avg:98.6 F (37 C), Min:98.6 F (37 C), Max:98.6 F (37 C)  Recent Labs  Lab 08/21/18 1253  WBC 10.9*  CREATININE 2.04*    Estimated Creatinine Clearance: 65.5 mL/min (A) (by C-G formula based on SCr of 2.04 mg/dL (H)).    Allergies  Allergen Reactions  . Cheese Hives and Swelling    Antimicrobials this admission: Zosyn 6/3 x 1 in ED Vancomycin 6/3 >>  Unasyn 6/3 >>  Thank you for allowing pharmacy to be a part of this patient's care.  Clovia Cuff, PharmD, BCPS 08/21/2018 5:11 PM

## 2018-08-21 NOTE — ED Notes (Signed)
Pt reports pain 10/10 in groin area and right leg, notified Darl Pikes, [place], verbal order given for morphine 4mg 

## 2018-08-21 NOTE — Progress Notes (Signed)
Sent msg to [employer] regarding critical lab value lactic acid 2.4

## 2018-08-22 DIAGNOSIS — R1031 Right lower quadrant pain: Secondary | ICD-10-CM

## 2018-08-22 LAB — CBC
HCT: 40.1 % (ref 39.0–52.0)
Hemoglobin: 13.3 g/dL (ref 13.0–17.0)
MCH: 27.4 pg (ref 26.0–34.0)
MCHC: 33.2 g/dL (ref 30.0–36.0)
MCV: 82.5 fL (ref 80.0–100.0)
Platelets: 185 10*3/uL (ref 150–400)
RBC: 4.86 MIL/uL (ref 4.22–5.81)
RDW: 14 % (ref 11.5–15.5)
WBC: 10.2 10*3/uL (ref 4.0–10.5)
nRBC: 0 % (ref 0.0–0.2)

## 2018-08-22 LAB — BASIC METABOLIC PANEL
Anion gap: 7 (ref 5–15)
BUN: 22 mg/dL — ABNORMAL HIGH (ref 6–20)
CO2: 24 mmol/L (ref 22–32)
Calcium: 7.7 mg/dL — ABNORMAL LOW (ref 8.9–10.3)
Chloride: 106 mmol/L (ref 98–111)
Creatinine, Ser: 1.79 mg/dL — ABNORMAL HIGH (ref 0.61–1.24)
GFR calc Af Amer: 53 mL/min — ABNORMAL LOW (ref >60)
GFR calc non Af Amer: 45 mL/min — ABNORMAL LOW (ref >60)
Glucose, Bld: 272 mg/dL — ABNORMAL HIGH (ref 70–99)
Potassium: 4 mmol/L (ref 3.5–5.1)
Sodium: 137 mmol/L (ref 135–145)

## 2018-08-22 LAB — GLUCOSE, CAPILLARY
Glucose-Capillary: 376 mg/dL — ABNORMAL HIGH (ref 70–99)
Glucose-Capillary: 287 mg/dL — ABNORMAL HIGH (ref 70–99)
Glucose-Capillary: 278 mg/dL — ABNORMAL HIGH (ref 70–99)
Glucose-Capillary: 403 mg/dL — ABNORMAL HIGH (ref 70–99)
Glucose-Capillary: 379 mg/dL — ABNORMAL HIGH (ref 70–99)

## 2018-08-22 MED ORDER — INSULIN GLARGINE 100 UNIT/ML ~~LOC~~ SOLN
40.0000 [IU] | Freq: Every day | SUBCUTANEOUS | Status: DC
  Filled 2018-08-22: qty 0.4

## 2018-08-22 MED ORDER — INSULIN ASPART 100 UNIT/ML ~~LOC~~ SOLN
6.0000 [IU] | Freq: Three times a day (TID) | SUBCUTANEOUS | Status: DC
  Administered 2018-08-22 – 2018-08-26 (×12): 6 [IU] via SUBCUTANEOUS
  Filled 2018-08-22 (×12): qty 1

## 2018-08-22 MED ORDER — HYDRALAZINE HCL 20 MG/ML IJ SOLN
10.0000 mg | Freq: Once | INTRAMUSCULAR | Status: AC
  Administered 2018-08-22: 10 mg via INTRAVENOUS
  Filled 2018-08-22: qty 1

## 2018-08-22 MED ORDER — ORAL CARE MOUTH RINSE
15.0000 mL | Freq: Two times a day (BID) | OROMUCOSAL | Status: DC
  Administered 2018-08-22 – 2018-08-26 (×4): 15 mL via OROMUCOSAL

## 2018-08-22 MED ORDER — INSULIN ASPART 100 UNIT/ML ~~LOC~~ SOLN
10.0000 [IU] | Freq: Once | SUBCUTANEOUS | Status: AC
  Administered 2018-08-22: 10 [IU] via SUBCUTANEOUS

## 2018-08-22 MED ORDER — LIVING WELL WITH DIABETES BOOK
Freq: Once | Status: AC
  Administered 2018-08-22: 13:00:00
  Filled 2018-08-22: qty 1

## 2018-08-22 MED ORDER — INSULIN GLARGINE 100 UNIT/ML ~~LOC~~ SOLN
14.0000 [IU] | Freq: Once | SUBCUTANEOUS | Status: AC
  Administered 2018-08-22: 14 [IU] via SUBCUTANEOUS
  Filled 2018-08-22: qty 0.14

## 2018-08-22 NOTE — Consult Note (Signed)
Urology Consult  I have been asked to see the patient by Dr. Allena Katz, for evaluation and management of right groin cellulitis.  Chief Complaint: Right groin pain  History of Present Illness: Micheal Flam. is a 43 y.o. year old with a history of poorly controlled diabetes who presented to the emergency room yesterday with right groin cellulitis.  Urology was asked to consult to rule out Fournier's gangrene or need for surgical intervention.  Patient reports that over the past 2 weeks, he is developed firmness and tenderness in his right suprapubic area extending down into his groin and upper scrotum.  He reports that this is been slowly worsening.  It started with a boil and has progressed.  He reports that when he voids, his scrotum will "draw up" and become painful.  He denies any overt dysuria or voiding issues.  Upon arrival to the emergency room, he was noted to have markedly elevated blood sugar greater than 600 but no evidence of leukocytosis or fevers.  A CT noncontrast showed stranding and lymphadenopathy in his right inguinal area extending into his suprapubic and upper scrotal area without any obvious fluid collections, gas, or signs of Fournier's gangrene.  Patient denies a personal history of scrotal infections.  Past Medical History:  Diagnosis Date  . Atypical chest pain    a. 08/2015, neg troponin;  b. 08/2015 Echo: EF 50-55%, no rwma, nl RV fxn.  . Diabetes mellitus without complication (HCC)    a. 08/2015: uncontrolled, A1c 13.4  . Essential hypertension   . Hyperlipidemia   . Morbid obesity (HCC)   . Stroke [facility])     Past Surgical History:  Procedure Laterality Date  . HAND SURGERY      Home Medications:  Current Meds  Medication Sig  . atorvastatin (LIPITOR) 80 MG tablet Take 80 mg by mouth daily at 6 PM.   . insulin aspart (NOVOLOG) 100 UNIT/ML FlexPen Inject 20-22 Units into the skin 3 (three) times daily with meals. Just filled at [facility] on 08/19/18 Novolog 22 units with breakfast and lunch, Novolog 20 units with supper  . Insulin Detemir (LEVEMIR) 100 UNIT/ML Pen Inject 44 Units into the skin at bedtime. Just filled at [facility] on 08/19/18  . lisinopril (PRINIVIL,ZESTRIL) 10 MG tablet Take 10 mg by mouth.  . lovastatin (MEVACOR) 40 MG tablet Take 1 tablet (40 mg total) by mouth at bedtime.  . metFORMIN (GLUCOPHAGE) 1000 MG tablet Take 1,000 mg by mouth 2 (two) times a day.   . [DISCONTINUED] insulin NPH-regular Human (NOVOLIN 70/30) (70-30) 100 UNIT/ML injection Inject 18 Units into the skin 2 (two) times daily with a meal.  . [DISCONTINUED] insulin regular (NOVOLIN R,HUMULIN R) 100 units/mL injection Inject 48 Units into the skin at bedtime.     Allergies:  Allergies  Allergen Reactions  . Cheese Hives and Swelling    Family History  Problem Relation Age of Onset  . Diabetes Mellitus II Maternal Grandmother   . Diabetes Mellitus II Mother   . Hypertension Mother   . Diabetes Mellitus II Father   . Hypertension Father     Social History:  reports that he quit smoking about 2 years ago. His smoking use included cigars. He has never used smokeless tobacco. He reports that he does not drink alcohol or use drugs.  ROS: A complete review of systems was performed.  In addition to the above, he reports chills fatigue malaise  and some abdominal pain.  All 10 systems are negative except for pertinent findings as noted.  Physical Exam:  Vital signs in last 24 hours: Temp:  [98.6 F (37 C)-99.5 F (37.5 C)] 99.5 F (37.5 C) (06/04 0339) Pulse Rate:  [88-100] 92 (06/04 0732) Resp:  [18-20] 19 (06/04 0339) BP: (107-139)/(54-80) 139/77 (06/04 0732) SpO2:  [79 %-100 %] 100 % (06/04 0732) Weight:  [131.5 kg-136 kg] 136 kg (06/04 0339) Constitutional:  Alert and oriented, No acute distress, slightly groggy but awakens to alert then drifts back off.   HEENT: [place] AT, moist mucus membranes.   Trachea midline, no masses Respiratory: Normal respiratory effort, wearing O2 GI: Abdomen is soft, nontender, nondistended, no abdominal masses, obese GU: Right suprapubic induration extending into the right upper hemiscrotum and right inguinal area with palpable lymphadenopathy but no fluctuance or crepitance.  Dependent scrotum is fairly unremarkable.  Left and right testicle normal.  No obvious fluid collections palpable.  Perineum intact.  Mildly edematous penile shaft skin.  Orthotopic meatus without discharge. Skin: No rashes, bruises or suspicious lesions Neurologic: Grossly intact, no focal deficits, moving all 4 extremities Psychiatric: Normal mood and affect.  Answering questions appropriate.   Laboratory Data:  Recent Labs    08/21/18 1253 08/22/18 0316  WBC 10.9* 10.2  HGB 14.5 13.3  HCT 42.8 40.1   Recent Labs    08/21/18 1253 08/22/18 0316  NA 129* 137  K 4.1 4.0  CL 93* 106  CO2 23 24  GLUCOSE 661* 272*  BUN 24* 22*  CREATININE 2.04* 1.79*  CALCIUM 7.8* 7.7*    Radiologic Imaging: CLINICAL DATA:  Abdominal pain and fever. Nausea and vomiting. Right inguinal region swelling.  EXAM: CT ABDOMEN AND PELVIS WITHOUT CONTRAST  TECHNIQUE: Multidetector CT imaging of the abdomen and pelvis was performed following the standard protocol without oral or IV contrast.  COMPARISON:  None.  FINDINGS: Lower chest: There is atelectatic change in the lung bases. No lung base edema or consolidation is evident.  Hepatobiliary: No focal liver lesions are appreciable on this noncontrast enhanced study. The gallbladder wall is not appreciably thickened. There is no biliary duct dilatation.  Pancreas: There is no evident pancreatic mass or inflammatory focus.  Spleen: No splenic lesions are evident.  Adrenals/Urinary Tract: Adrenals bilaterally appear unremarkable. Kidneys bilaterally show no evident mass or hydronephrosis on either side. There is no evident  renal or ureteral calculus on either side. Urinary bladder is midline with wall thickness within normal limits.  Stomach/Bowel: There is no appreciable bowel wall or mesenteric thickening. Terminal ileum appears normal. There is no bowel obstruction. There is no evident free air or portal venous air.  Vascular/Lymphatic: There is no abdominal aortic aneurysm. No vascular lesions are evident.  Reproductive: There are prominent inguinal lymph nodes in the medial lower pelvis and upper thigh region with surrounding soft tissue stranding. The largest lymph node in this area measures 2.4 x 1.5 cm. There is subcutaneous thickening of the lower pelvic/upper thigh region wall in the anterior right lower inguinal region. There is no frank fluid collection or abscess. No abnormal air in this area. No bowel containing hernia in this area. Elsewhere, there are several borderline prominent inguinal lymph nodes which have central fatty hila, suggesting benign etiology. There are a few subcentimeter common femoral artery lymph nodes which do not meet size criteria for pathologic significance. No adenopathy based on size criteria noted within the peritoneum or retroperitoneum of the abdomen and pelvis.  Other: Appendix appears normal. No abscess or ascites evident in the abdomen or pelvis.  Musculoskeletal: No blastic or lytic bone lesions. No intramuscular lesions are evident.  IMPRESSION: 1. Localized adenopathy and soft tissue stranding with subcutaneous thickening in the medial right lower inguinal and proximal thigh region extending to the upper scrotal sac. No associated fluid or frank abscess. No air in this area. There is inflammatory change which may be due to a combination of adenitis and panniculitis. No inflammatory change of this nature seen elsewhere on this study  2. No evident bowel obstruction. No abscess in the abdomen or pelvis. Appendix appears normal. No evident  hernia.  3. No evident renal or ureteral calculus. No hydronephrosis. Urinary bladder wall thickness is normal.   Electronically Signed   By: Bretta BangWilliam  Woodruff III M.D.   On: 08/21/2018 14:37  CT scan was personally reviewed, agree with above interpretation.  No gas or fluid collections appreciated.  Impression/Assessment:  43 year old male presenting with right inguinal/suprapubic cellulitis with induration but no evidence of fluid collections or Fournier's gangrene (gas-forming infection).  CT scan supports exam findings today.  Plan:  -Agree with IV antibiotics in the form of Zosyn, broaden if fails to improve -Discussed scrotal support with the patient, proper scrotum on rolled up washcloths to help reduce any dependent edema and for comfort -Please page [email or web address] if exam worsens for reassessment  08/22/2018, 11:25 AM  Vanna ScotlandAshley Jim Lundin,  MD

## 2018-08-22 NOTE — Progress Notes (Signed)
[employer] - [place] [facility]                                                                                                                                                                                  Patient Demographics   Micheal Robinson, is a 43 y.o. male, DOB - 08-May-1975, ZOX:[id number]  Admit date - 08/21/2018   Admitting Physician Milagros Loll, MD  Outpatient Primary MD for the patient is [facility]   LOS - 1  Subjective: Patient continues to complaint of pain in the right groin states is severe Blood sugars improved but still high   Review of Systems:   CONSTITUTIONAL: No documented fever. No fatigue, weakness. No weight gain, no weight loss.  EYES: No blurry or double vision.  ENT: No tinnitus. No postnasal drip. No redness of the oropharynx.  RESPIRATORY: No cough, no wheeze, no hemoptysis. No dyspnea.  CARDIOVASCULAR: No chest pain. No orthopnea. No palpitations. No syncope.  GASTROINTESTINAL: No nausea, no vomiting or diarrhea. No abdominal pain. No melena or hematochezia.  GENITOURINARY: No dysuria or hematuria.  Positive right groin pain ENDOCRINE: No polyuria or nocturia. No heat or cold intolerance.  HEMATOLOGY: No anemia. No bruising. No bleeding.  INTEGUMENTARY: No rashes. No lesions.  MUSCULOSKELETAL: No arthritis. No swelling. No gout.  NEUROLOGIC: No numbness, tingling, or ataxia. No seizure-type activity.  PSYCHIATRIC: No anxiety. No insomnia. No ADD.    Vitals:   Vitals:   08/21/18 1930 08/21/18 2127 08/22/18 0339 08/22/18 0732  BP: 118/76 136/80 (!) 107/54 139/77  Pulse: 92 92 95 92  Resp:  20 19   Temp:  99.1 F (37.3 C) 99.5 F (37.5 C)   TempSrc:  Oral Oral   SpO2: 97% 94% 94% 100%  Weight:  136 kg 136 kg   Height:        Wt Readings from Last 3 Encounters:  08/22/18 136 kg  03/06/17 132.9 kg  07/12/16 (!) 142.9 kg     Intake/Output Summary (Last 24 hours) at 08/22/2018 1139 Last  data filed at 08/21/2018 1543 Gross per 24 hour  Intake 50 ml  Output -  Net 50 ml    Physical Exam:   GENERAL: Pleasant-appearing in no apparent distress.  HEAD, EYES, EARS, NOSE AND THROAT: Atraumatic, normocephalic. Extraocular muscles are intact. Pupils equal and reactive to light. Sclerae anicteric. No conjunctival injection. No oro-pharyngeal erythema.  NECK: Supple. There is no jugular venous distention. No bruits, no lymphadenopathy, no thyromegaly.  HEART: Regular rate and rhythm,. No murmurs, no rubs, no clicks.  LUNGS: Clear to auscultation bilaterally. No rales or rhonchi. No wheezes.  ABDOMEN: Soft, flat,  nontender, nondistended. Has good bowel sounds. No hepatosplenomegaly appreciated.  EXTREMITIES: No evidence of any cyanosis, clubbing, or peripheral edema.  +2 pedal and radial pulses bilaterally.  NEUROLOGIC: The patient is alert, awake, and oriented x3 with no focal motor or sensory deficits appreciated bilaterally.  SKIN: Moist and warm with no rashes appreciated.  Psych: Not anxious, depressed LN: No inguinal LN enlargement GU: There is a large area of hardness felt in the right groin there is no fluctuance   Antibiotics   Anti-infectives (From admission, onward)   Start     Dose/Rate Route Frequency Ordered Stop   08/22/18 1800  vancomycin (VANCOCIN) 1,500 mg in sodium chloride 0.9 % 500 mL IVPB     1,500 mg 250 mL/hr over 120 Minutes Intravenous Every 24 hours 08/21/18 1708     08/21/18 1715  vancomycin (VANCOCIN) 2,500 mg in sodium chloride 0.9 % 500 mL IVPB     2,500 mg 250 mL/hr over 120 Minutes Intravenous  Once 08/21/18 1708 08/21/18 2054   08/21/18 1715  Ampicillin-Sulbactam (UNASYN) 3 g in sodium chloride 0.9 % 100 mL IVPB     3 g 200 mL/hr over 30 Minutes Intravenous Every 6 hours 08/21/18 1708     08/21/18 1500  piperacillin-tazobactam (ZOSYN) IVPB 3.375 g     3.375 g 100 mL/hr over 30 Minutes Intravenous  Once 08/21/18 1446 08/21/18 1543       Medications   Scheduled Meds: . atorvastatin  80 mg Oral q1800  . enoxaparin (LOVENOX) injection  40 mg Subcutaneous Q24H  . insulin aspart  0-20 Units Subcutaneous TID WC  . insulin aspart  0-5 Units Subcutaneous QHS  . insulin aspart  20 Units Subcutaneous Once  . insulin glargine  14 Units Subcutaneous Once  . [START ON 08/23/2018] insulin glargine  40 Units Subcutaneous Daily  . living well with diabetes book   Does not apply Once  . mouth rinse  15 mL Mouth Rinse BID   Continuous Infusions: . sodium chloride 125 mL/hr at 08/22/18 1000  . ampicillin-sulbactam (UNASYN) IV 3 g (08/22/18 0656)  . vancomycin     PRN Meds:.acetaminophen **OR** acetaminophen, ondansetron **OR** ondansetron (ZOFRAN) IV, polyethylene glycol   Data Review:   Micro Results Recent Results (from the past 240 hour(s))  SARS Coronavirus 2 (CEPHEID - Performed in [facility] lab), Hosp Order     Status: None   Collection Time: 08/21/18  3:54 PM  Result Value Ref Range Status   SARS Coronavirus 2 NEGATIVE NEGATIVE Final    Comment: (NOTE) If result is NEGATIVE SARS-CoV-2 target nucleic acids are NOT DETECTED. The SARS-CoV-2 RNA is generally detectable in upper and lower  respiratory specimens during the acute phase of infection. The lowest  concentration of SARS-CoV-2 viral copies this assay can detect is 250  copies / mL. A negative result does not preclude SARS-CoV-2 infection  and should not be used as the sole basis for treatment or other  patient management decisions.  A negative result may occur with  improper specimen collection / handling, submission of specimen other  than nasopharyngeal swab, presence of viral mutation(s) within the  areas targeted by this assay, and inadequate number of viral copies  (<250 copies / mL). A negative result must be combined with clinical  observations, patient history, and epidemiological information. If result is POSITIVE SARS-CoV-2 target  nucleic acids are DETECTED. The SARS-CoV-2 RNA is generally detectable in upper and lower  respiratory specimens dur ing the acute  phase of infection.  Positive  results are indicative of active infection with SARS-CoV-2.  Clinical  correlation with patient history and other diagnostic information is  necessary to determine patient infection status.  Positive results do  not rule out bacterial infection or co-infection with other viruses. If result is PRESUMPTIVE POSTIVE SARS-CoV-2 nucleic acids MAY BE PRESENT.   A presumptive positive result was obtained on the submitted specimen  and confirmed on repeat testing.  While 2019 novel coronavirus  (SARS-CoV-2) nucleic acids may be present in the submitted sample  additional confirmatory testing may be necessary for epidemiological  and / or clinical management purposes  to differentiate between  SARS-CoV-2 and other Sarbecovirus currently known to infect humans.  If clinically indicated additional testing with an alternate test  methodology [phone]) is advised. The SARS-CoV-2 RNA is generally  detectable in upper and lower respiratory sp ecimens during the acute  phase of infection. The expected result is Negative. Fact Sheet for Patients:  BoilerBrush.com.cy Fact Sheet for Healthcare Providers: https://pope.com/ This test is not yet approved or cleared by the [place] FDA and has been authorized for detection and/or diagnosis of SARS-CoV-2 by FDA under an Emergency Use Authorization (EUA).  This EUA will remain in effect (meaning this test can be used) for the duration of the COVID-19 declaration under Section 564(b)(1) of the Act, 21 U.S.C. section 360bbb-3(b)(1), unless the authorization is terminated or revoked sooner. Performed at [facility], [address] [place], [place] [address]     Radiology Reports Ct Abdomen Pelvis Wo Contrast  Result Date:  08/21/2018 CLINICAL DATA:  Abdominal pain and fever. Nausea and vomiting. Right inguinal region swelling. EXAM: CT ABDOMEN AND PELVIS WITHOUT CONTRAST TECHNIQUE: Multidetector CT imaging of the abdomen and pelvis was performed following the standard protocol without oral or IV contrast. COMPARISON:  None. FINDINGS: Lower chest: There is atelectatic change in the lung bases. No lung base edema or consolidation is evident. Hepatobiliary: No focal liver lesions are appreciable on this noncontrast enhanced study. The gallbladder wall is not appreciably thickened. There is no biliary duct dilatation. Pancreas: There is no evident pancreatic mass or inflammatory focus. Spleen: No splenic lesions are evident. Adrenals/Urinary Tract: Adrenals bilaterally appear unremarkable. Kidneys bilaterally show no evident mass or hydronephrosis on either side. There is no evident renal or ureteral calculus on either side. Urinary bladder is midline with wall thickness within normal limits. Stomach/Bowel: There is no appreciable bowel wall or mesenteric thickening. Terminal ileum appears normal. There is no bowel obstruction. There is no evident free air or portal venous air. Vascular/Lymphatic: There is no abdominal aortic aneurysm. No vascular lesions are evident. Reproductive: There are prominent inguinal lymph nodes in the medial lower pelvis and upper thigh region with surrounding soft tissue stranding. The largest lymph node in this area measures 2.4 x 1.5 cm. There is subcutaneous thickening of the lower pelvic/upper thigh region wall in the anterior right lower inguinal region. There is no frank fluid collection or abscess. No abnormal air in this area. No bowel containing hernia in this area. Elsewhere, there are several borderline prominent inguinal lymph nodes which have central fatty hila, suggesting benign etiology. There are a few subcentimeter common femoral artery lymph nodes which do not meet size criteria for pathologic  significance. No adenopathy based on size criteria noted within the peritoneum or retroperitoneum of the abdomen and pelvis. Other: Appendix appears normal. No abscess or ascites evident in the abdomen or pelvis. Musculoskeletal: No blastic or  lytic bone lesions. No intramuscular lesions are evident. IMPRESSION: 1. Localized adenopathy and soft tissue stranding with subcutaneous thickening in the medial right lower inguinal and proximal thigh region extending to the upper scrotal sac. No associated fluid or frank abscess. No air in this area. There is inflammatory change which may be due to a combination of adenitis and panniculitis. No inflammatory change of this nature seen elsewhere on this study 2. No evident bowel obstruction. No abscess in the abdomen or pelvis. Appendix appears normal. No evident hernia. 3. No evident renal or ureteral calculus. No hydronephrosis. Urinary bladder wall thickness is normal. Electronically Signed   By: Bretta Bang III M.D.   On: 08/21/2018 14:37   US Venous Img Lower Unilateral Right  Result Date: 08/21/2018 CLINICAL DATA:  Right lower extremity pain and edema for the past 2 weeks. History of smoking. Evaluate for DVT. EXAM: RIGHT LOWER EXTREMITY VENOUS DOPPLER ULTRASOUND TECHNIQUE: Gray-scale sonography with graded compression, as well as color Doppler and duplex ultrasound were performed to evaluate the lower extremity deep venous systems from the level of the common femoral vein and including the common femoral, femoral, profunda femoral, popliteal and calf veins including the posterior tibial, peroneal and gastrocnemius veins when visible. The superficial great saphenous vein was also interrogated. Spectral Doppler was utilized to evaluate flow at rest and with distal augmentation maneuvers in the common femoral, femoral and popliteal veins. COMPARISON:  None. FINDINGS: Contralateral Common Femoral Vein: Respiratory phasicity is normal and symmetric with the  symptomatic side. No evidence of thrombus. Normal compressibility. Common Femoral Vein: No evidence of thrombus. Normal compressibility, respiratory phasicity and response to augmentation. Saphenofemoral Junction: No evidence of thrombus. Normal compressibility and flow on color Doppler imaging. Profunda Femoral Vein: No evidence of thrombus. Normal compressibility and flow on color Doppler imaging. Femoral Vein: No evidence of thrombus. Normal compressibility, respiratory phasicity and response to augmentation. Popliteal Vein: No evidence of thrombus. Normal compressibility, respiratory phasicity and response to augmentation. Calf Veins: No evidence of thrombus. Normal compressibility and flow on color Doppler imaging. Superficial Great Saphenous Vein: No evidence of thrombus. Normal compressibility. Venous Reflux:  None. Other Findings:  None. IMPRESSION: No evidence of DVT within the right lower extremity. Electronically Signed   By: Simonne Come M.D.   On: 08/21/2018 14:00     CBC Recent Labs  Lab 08/21/18 1253 08/22/18 0316  WBC 10.9* 10.2  HGB 14.5 13.3  HCT 42.8 40.1  PLT 203 185  MCV 81.5 82.5  MCH 27.6 27.4  MCHC 33.9 33.2  RDW 14.1 14.0  LYMPHSABS 1.2  --   MONOABS 1.1*  --   EOSABS 0.3  --   BASOSABS 0.0  --     Chemistries  Recent Labs  Lab 08/21/18 1253 08/22/18 0316  NA 129* 137  K 4.1 4.0  CL 93* 106  CO2 23 24  GLUCOSE 661* 272*  BUN 24* 22*  CREATININE 2.04* 1.79*  CALCIUM 7.8* 7.7*  AST 19  --   ALT 22  --   ALKPHOS 71  --   BILITOT 0.6  --    ------------------------------------------------------------------------------------------------------------------ estimated creatinine clearance is 76 mL/min (A) (by C-G formula based on SCr of 1.79 mg/dL (H)). ------------------------------------------------------------------------------------------------------------------ No results for input(s): HGBA1C in the last 72  hours. ------------------------------------------------------------------------------------------------------------------ No results for input(s): CHOL, HDL, LDLCALC, TRIG, CHOLHDL, LDLDIRECT in the last 72 hours. ------------------------------------------------------------------------------------------------------------------ No results for input(s): TSH, T4TOTAL, T3FREE, THYROIDAB in the last 72 hours.  Invalid input(s): FREET3 ------------------------------------------------------------------------------------------------------------------  No results for input(s): VITAMINB12, FOLATE, FERRITIN, TIBC, IRON, RETICCTPCT in the last 72 hours.  Coagulation profile No results for input(s): INR, PROTIME in the last 168 hours.  No results for input(s): DDIMER in the last 72 hours.  Cardiac Enzymes No results for input(s): CKMB, TROPONINI, MYOGLOBIN in the last 168 hours.  Invalid input(s): CK ------------------------------------------------------------------------------------------------------------------ Invalid input(s): POCBNP    Assessment & Plan  Patient's 43 year old with diabetes presenting with right groin pain   *Severe hyperglycemia.  No DKA.   Check a hemoglobin A1c We will increase his Lantus to 38 units Add pre-meal insulin   *Right groin cellulitis with no air or fluid on the CT scan.   Continue IV vancomycin and Unasyn He is at high risk of Fournier's gangrene I have asked urology to evaluate him   *Acute kidney injury over CKD stage III.   Improved with IV fluids  *Hypertension.    Hold lisinopril due to elevated creatinine  *Morbid obesity weight loss recommended  *DVT prophylaxis with renally dosed Lovenox.  All the records are reviewed and case discussed with ED provider. Management plans discussed with the patient, family and they are in agreement.  CODE STATUS: Full code      Code Status Orders  (From admission, onward)         Start      Ordered   08/21/18 1632  Full code  Continuous     08/21/18 1631        Code Status History    Date Active Date Inactive Code Status Order ID Comments User Context   08/31/2015 2323 09/01/2015 2035 Full Code [id number]  Arnaldo Natal, MD Inpatient           Consults urology  DVT Prophylaxis  Lovenox  Lab Results  Component Value Date   PLT 185 08/22/2018     Time Spent in minutes 35 minutes  Greater than 50% of time spent in care coordination and counseling patient regarding the condition and plan of care.   Auburn Bilberry M.D on 08/22/2018 at 11:39 AM  Between 7am to 6pm - Pager - [phone]  After 6pm go to [email or web address] - Social research officer, government  [employer]   Office  [phone]

## 2018-08-22 NOTE — Progress Notes (Signed)
Inpatient Diabetes Program Recommendations  AACE/ADA: New Consensus Statement on Inpatient Glycemic Control   Target Ranges:  Prepandial:   less than 140 mg/dL      Peak postprandial:   less than 180 mg/dL (1-2 hours)      Critically ill patients:  140 - 180 mg/dL   Results for GARIN, KOPERSKI (MRN [id number]) as of 08/22/2018 09:30  Ref. Range 08/21/2018 16:51 08/21/2018 19:23 08/21/2018 20:47 08/21/2018 22:57 08/22/2018 07:33  Glucose-Capillary Latest Ref Range: 70 - 99 mg/dL 169 (HH) 678 (HH) 938 (HH) 442 (H) 376 (H)  Results for LEALON, SKILLINGS (MRN [id number]) as of 08/22/2018 09:30  Ref. Range 08/21/2018 12:53  Glucose Latest Ref Range: 70 - 99 mg/dL [facility])  Results for FONG, MARCOS (MRN [id number]) as of 08/22/2018 09:30  Ref. Range 08/31/2015 23:27  Hemoglobin A1C Latest Ref Range: 4.0 - 6.0 % 13.4 (H)    Review of Glycemic Control  Diabetes history: DM2 Outpatient Diabetes medications: Levemir 44 units QHS, Novolog 22 units with breakfast and lunch, Novolog 20 units with supper  Current orders for Inpatient glycemic control: Lantus 26 units QHS, Novolog 0-20 units TID with meals, Novolog 0-5 units QHS  Inpatient Diabetes Program Recommendations:   Insulin - Basal: Fasting glucose 376 mg/dl today. Please consider increasing Lantus to 40 units daily (to start now).  Insulin - Meal Coverage: Please consider ordering Novolog 6 units TID with meals for meal coverage if patient eats at least 50% of meals.  HgbA1C: A1C in process.  NOTE: Spoke with patient over the phone about diabetes and home regimen for diabetes control. Patient reports being followed by [facility] for diabetes management and he was just seen at the clinic this week. Patient reports that he had not taken any DM medication in a long time but was just restarted on insulin and he get it filled this week. Home medication list has 70/30 and Regular insulin listed and patient stated that is what he was  taking. However, called [facility] to verify since home medication list has Regular 48 units QHS and 70/30 18 units BID. Was informed by pharmacy at [facility] that patient picked up Levemir and Novolog insulin pens on 08/19/18 and he is prescribed Levemir 44 units QHS, Novolog 33 units with breakfast and lunch, and Novolog 20 units with supper.  Informed patient of what the pharmacy said he is prescribed and how they should be taken.  Patient reports that he started taking the insulin on the day he got them filled.  However, initial glucose noted to be 661 mg/dl on 08/19/42.  Patient states that he is not checking his glucose at home and notes that he does not have a glucometer at home. Informed patient it would be requested that a prescription for a glucometer and testing supplies be given to the patient at discharge. Explained that if Medicaid does not cover a glucometer or testing supplies, it would be more affordable for him to go to [employer] and purchase the Reli-On Premier Classic glucometer for $9 and a box of 50 test strips for $9.  Discussed glucose and A1C goals. Patient states that he does not recall what his last A1C was or how long it had been since it was checked. Informed patient that a current A1C has been ordered.  Discussed importance of checking CBGs and maintaining good CBG control to prevent long-term and short-term complications. Explained how hyperglycemia leads to damage within  blood vessels which lead to the common complications seen with uncontrolled diabetes. Stressed to the patient the importance of improving glycemic control to prevent further complications from uncontrolled diabetes. Discussed impact of nutrition, exercise, stress, sickness, and medications on diabetes control.  Encouraged patient to get a glucometer and check glucose 4 times per day (before meals and at bedtime) and to keep a log book of glucose readings and DM medication taken which patient will need to  take to doctor appointments. Explained how the doctor can use the log book to continue to make adjustments with DM medications if needed.  Patient verbalized understanding of information discussed and reports no further questions at this time related to diabetes.    Thanks, Orlando PennerMarie Basma Buchner, RN, MSN, CDE Diabetes Coordinator Inpatient Diabetes Program [phone] (Team Pager)

## 2018-08-22 NOTE — Progress Notes (Signed)
Pt had elavated blood glucose of 403mg /dL and received 5units Novolog per sliding scale. On-call Medical Provider paged and new orders received to administer an additional 10units of Novolog.

## 2018-08-22 NOTE — Progress Notes (Signed)
Patient with elevated BP. Give 10mg  IV hydralazine once per Ailene Ravel, NP.

## 2018-08-23 ENCOUNTER — Inpatient Hospital Stay: Payer: Medicaid Other

## 2018-08-23 LAB — HEMOGLOBIN A1C
Hgb A1c MFr Bld: >15.5 % — ABNORMAL HIGH (ref 4.8–5.6)
Mean Plasma Glucose: >398 mg/dL

## 2018-08-23 LAB — CBC
HCT: 41.8 % (ref 39.0–52.0)
Hemoglobin: 13.7 g/dL (ref 13.0–17.0)
MCH: 27.8 pg (ref 26.0–34.0)
MCHC: 32.8 g/dL (ref 30.0–36.0)
MCV: 84.8 fL (ref 80.0–100.0)
Platelets: 189 10*3/uL (ref 150–400)
RBC: 4.93 MIL/uL (ref 4.22–5.81)
RDW: 14.1 % (ref 11.5–15.5)
WBC: 9.6 10*3/uL (ref 4.0–10.5)
nRBC: 0 % (ref 0.0–0.2)

## 2018-08-23 LAB — GLUCOSE, CAPILLARY
Glucose-Capillary: 193 mg/dL — ABNORMAL HIGH (ref 70–99)
Glucose-Capillary: 256 mg/dL — ABNORMAL HIGH (ref 70–99)
Glucose-Capillary: 243 mg/dL — ABNORMAL HIGH (ref 70–99)
Glucose-Capillary: 308 mg/dL — ABNORMAL HIGH (ref 70–99)

## 2018-08-23 LAB — BASIC METABOLIC PANEL
Anion gap: 7 (ref 5–15)
BUN: 17 mg/dL (ref 6–20)
CO2: 26 mmol/L (ref 22–32)
Calcium: 8 mg/dL — ABNORMAL LOW (ref 8.9–10.3)
Chloride: 106 mmol/L (ref 98–111)
Creatinine, Ser: 1.64 mg/dL — ABNORMAL HIGH (ref 0.61–1.24)
GFR calc Af Amer: 58 mL/min — ABNORMAL LOW (ref >60)
GFR calc non Af Amer: 50 mL/min — ABNORMAL LOW (ref >60)
Glucose, Bld: 256 mg/dL — ABNORMAL HIGH (ref 70–99)
Potassium: 4.2 mmol/L (ref 3.5–5.1)
Sodium: 139 mmol/L (ref 135–145)

## 2018-08-23 LAB — HIV ANTIBODY (ROUTINE TESTING W REFLEX): HIV Screen 4th Generation wRfx: NONREACTIVE

## 2018-08-23 MED ORDER — ENOXAPARIN SODIUM 40 MG/0.4ML ~~LOC~~ SOLN
40.0000 mg | Freq: Two times a day (BID) | SUBCUTANEOUS | Status: DC
  Administered 2018-08-23 – 2018-08-26 (×7): 40 mg via SUBCUTANEOUS
  Filled 2018-08-23 (×7): qty 0.4

## 2018-08-23 MED ORDER — OXYCODONE-ACETAMINOPHEN 5-325 MG PO TABS
1.0000 | ORAL_TABLET | ORAL | Status: DC | PRN
  Administered 2018-08-23 – 2018-08-27 (×15): 2 via ORAL
  Filled 2018-08-23 (×16): qty 2

## 2018-08-23 MED ORDER — VANCOMYCIN HCL 10 G IV SOLR
2000.0000 mg | INTRAVENOUS | Status: DC
  Administered 2018-08-23 – 2018-08-24 (×2): 2000 mg via INTRAVENOUS
  Filled 2018-08-23 (×4): qty 2000

## 2018-08-23 MED ORDER — INSULIN GLARGINE 100 UNIT/ML ~~LOC~~ SOLN
45.0000 [IU] | Freq: Every day | SUBCUTANEOUS | Status: DC
  Administered 2018-08-23 – 2018-08-24 (×2): 45 [IU] via SUBCUTANEOUS
  Filled 2018-08-23 (×3): qty 0.45

## 2018-08-23 MED ORDER — SODIUM CHLORIDE 0.9 % IV SOLN
INTRAVENOUS | Status: DC | PRN
  Administered 2018-08-23 (×2): 250 mL via INTRAVENOUS
  Administered 2018-08-26: 15 mL via INTRAVENOUS

## 2018-08-23 MED ORDER — MORPHINE SULFATE (PF) 2 MG/ML IV SOLN: 2.0000 mg | INTRAVENOUS | Status: DC | PRN

## 2018-08-23 NOTE — Progress Notes (Signed)
PHARMACIST - PHYSICIAN COMMUNICATION  CONCERNING:  Enoxaparin (Lovenox) for DVT Prophylaxis    RECOMMENDATION: Patient was prescribed enoxaprin 40mg  q24 hours for VTE prophylaxis.   Filed Weights   08/21/18 2127 08/22/18 0339 08/23/18 0500  Weight: 299 lb 14.4 oz (136 kg) 299 lb 14.4 oz (136 kg) (!) 313 lb 1.9 oz (142 kg)    Body mass index is 42.47 kg/m.  Estimated Creatinine Clearance: 84.9 mL/min (A) (by C-G formula based on SCr of 1.64 mg/dL (H)).  Based on [facility] policy patient is candidate for enoxaparin 40mg  every 12 hour dosing due to BMI being >40.  DESCRIPTION: Pharmacy has adjusted enoxaparin dose per [facility] policy.  Patient is now receiving enoxaparin 40mg  every 12 hours.   Albina Billet, PharmD, BCPS Clinical Pharmacist 08/23/2018 7:21 AM

## 2018-08-23 NOTE — Progress Notes (Signed)
Inpatient Diabetes Program Recommendations  AACE/ADA: New Consensus Statement on Inpatient Glycemic Control   Target Ranges:  Prepandial:   less than 140 mg/dL      Peak postprandial:   less than 180 mg/dL (1-2 hours)      Critically ill patients:  140 - 180 mg/dL   Results for DEKON, TANN (MRN [id number]) as of 08/23/2018 09:25  Ref. Range 08/22/2018 07:33 08/22/2018 11:46 08/22/2018 16:33 08/22/2018 21:24 08/22/2018 22:59 08/23/2018 07:32  Glucose-Capillary Latest Ref Range: 70 - 99 mg/dL 119 (H) 147 (H) 829 (H) 403 (H) 379 (H) 193 (H)  Results for TYVAN, HOCKLEY (MRN [id number]) as of 08/23/2018 09:25  Ref. Range 08/31/2015 23:27 08/21/2018 12:53  Hemoglobin A1C Latest Ref Range: 4.8 - 5.6 % 13.4 (H) 16.0 (H)   Review of Glycemic Control  Diabetes history: DM2 Outpatient Diabetes medications: Levemir 44 units QHS, Novolog 22 units with breakfast and lunch, Novolog 20 units with supper Current orders for Inpatient glycemic control: Lantus 40 units QHS, Novolog 0-20 units TID with meals, Novolog 0-5 units QHS, Novolog 6 units TID with meals  Inpatient Diabetes Program Recommendations:   Insulin - Basal: Please consider increasing Lantus to 45 units daily (to start now).  Insulin - Meal Coverage: Please consider increasing meal coverage to Novolog 12 units TID with meals if patient eats at least 50% of meals.  HgbA1C: A1C 16% on 08/21/18 indicating an average glucose of 413 mg/dl over the past 2-3 months.  Thanks, Orlando Penner, RN, MSN, CDE Diabetes Coordinator Inpatient Diabetes Program [phone] (Team Pager from 8am to 5pm)

## 2018-08-23 NOTE — Progress Notes (Signed)
Pharmacy Antibiotic Note  Micheal Robinson. is a 43 y.o. male admitted on 08/21/2018 with cellulitis.  Pharmacy has been consulted for Vancomycin and Unasyn dosing.  Started on: Vancomycin 2500mg  IV loading dose followed by Vancomycin 1500 mg IV Q 24 hrs. Goal AUC 400-550. Expected AUC: 486.1 SCr used: 2.04 Expected Cmin: 11.7  Will change Vancomycin to: Vancomycin 2000 mg IV Q 24 hrs. Goal AUC 400-550. Expected AUC: 491.5 SCr used: 1.64   Will continue: Unasyn 3g IV q6h  Keep close watch on renal function.   Height: 6' (182.9 cm) Weight: (!) 313 lb 1.9 oz (142 kg) IBW/kg (Calculated) : 77.6  Temp (24hrs), Avg:99 F (37.2 C), Min:98.6 F (37 C), Max:99.4 F (37.4 C)  Recent Labs  Lab 08/21/18 1253 08/21/18 2219 08/22/18 0316 08/23/18 0242  WBC 10.9*  --  10.2 9.6  CREATININE 2.04*  --  1.79* 1.64*  LATICACIDVEN  --  2.4*  --   --     Estimated Creatinine Clearance: 84.9 mL/min (A) (by C-G formula based on SCr of 1.64 mg/dL (H)).    Allergies  Allergen Reactions  . Cheese Hives and Swelling    Antimicrobials this admission: Zosyn 6/3 x 1 in ED Vancomycin 6/3 >>  Unasyn 6/3 >>  Thank you for allowing pharmacy to be a part of this patient's care.  Albina Billet, PharmD, BCPS Clinical Pharmacist 08/23/2018 7:16 AM

## 2018-08-23 NOTE — Progress Notes (Signed)
[employer] [facility] [place] [facility]                                                                                                                                                                                  Patient Demographics   Micheal Robinson, is a 43 y.o. male, DOB - April 24, 1975, ONG:[id number]  Admit date - 08/21/2018   Admitting Physician Milagros Loll, MD  Outpatient Primary MD for the patient is [facility]   LOS - 2  Subjective: Patient continues to have swelling in his scrotal region, denies any significant change Continues to complain of pain in the right leg states that area is dark    Review of Systems:   CONSTITUTIONAL: No documented fever. No fatigue, weakness. No weight gain, no weight loss.  EYES: No blurry or double vision.  ENT: No tinnitus. No postnasal drip. No redness of the oropharynx.  RESPIRATORY: No cough, no wheeze, no hemoptysis. No dyspnea.  CARDIOVASCULAR: No chest pain. No orthopnea. No palpitations. No syncope.  GASTROINTESTINAL: No nausea, no vomiting or diarrhea. No abdominal pain. No melena or hematochezia.  GENITOURINARY: No dysuria or hematuria.  Positive right groin pain ENDOCRINE: No polyuria or nocturia. No heat or cold intolerance.  HEMATOLOGY: No anemia. No bruising. No bleeding.  INTEGUMENTARY: No rashes. No lesions.  MUSCULOSKELETAL: Right leg pain.  NEUROLOGIC: No numbness, tingling, or ataxia. No seizure-type activity.  PSYCHIATRIC: No anxiety. No insomnia. No ADD.    Vitals:   Vitals:   08/22/18 1808 08/22/18 1930 08/23/18 0500 08/23/18 0734  BP: (!) 140/109 (!) 161/98  (!) 155/92  Pulse: 93 98  97  Resp: Temp: 99 F (37.2 C) 98.6 F (37 C)  98.9 F (37.2 C)  TempSrc: Oral Oral    SpO2:  96%  95%  Weight:   (!) 142 kg   Height:        Wt Readings from Last 3 Encounters:  08/23/18 (!) 142 kg  03/06/17 132.9 kg  07/12/16 (!) 142.9 kg     Intake/Output Summary  (Last 24 hours) at 08/23/2018 1256 Last data filed at 08/23/2018 1015 Gross per 24 hour  Intake 3784.3 ml  Output -  Net 3784.3 ml    Physical Exam:   GENERAL: Pleasant-appearing in no apparent distress.  HEAD, EYES, EARS, NOSE AND THROAT: Atraumatic, normocephalic. Extraocular muscles are intact. Pupils equal and reactive to light. Sclerae anicteric. No conjunctival injection. No oro-pharyngeal erythema.  NECK: Supple. There is no jugular venous distention. No bruits, no lymphadenopathy, no thyromegaly.  HEART: Regular rate and rhythm,. No murmurs, no rubs, no clicks.  LUNGS: Clear to  auscultation bilaterally. No rales or rhonchi. No wheezes.  ABDOMEN: Soft, flat, nontender, nondistended. Has good bowel sounds. No hepatosplenomegaly appreciated.  EXTREMITIES: No evidence of any cyanosis, clubbing, or peripheral edema.  +2 pedal and radial pulses bilaterally.  Right lower extremity palpable external veins in the calf region on the shin there discoloration like a bruise there is no warmth felt NEUROLOGIC: The patient is alert, awake, and oriented x3 with no focal motor or sensory deficits appreciated bilaterally.  SKIN: Moist and warm with no rashes appreciated.  Psych: Not anxious, depressed LN: No inguinal LN enlargement GU: There is a large area of hardness felt in the right groin there is no fluctuance, similar to yesterday   Antibiotics   Anti-infectives (From admission, onward)   Start     Dose/Rate Route Frequency Ordered Stop   08/23/18 1800  vancomycin (VANCOCIN) 2,000 mg in sodium chloride 0.9 % 500 mL IVPB     2,000 mg 250 mL/hr over 120 Minutes Intravenous Every 24 hours 08/23/18 0716     08/22/18 1800  vancomycin (VANCOCIN) 1,500 mg in sodium chloride 0.9 % 500 mL IVPB  Status:  Discontinued     1,500 mg 250 mL/hr over 120 Minutes Intravenous Every 24 hours 08/21/18 1708 08/23/18 0716   08/21/18 1715  vancomycin (VANCOCIN) 2,500 mg in sodium chloride 0.9 % 500 mL IVPB      2,500 mg 250 mL/hr over 120 Minutes Intravenous  Once 08/21/18 1708 08/21/18 2054   08/21/18 1715  Ampicillin-Sulbactam (UNASYN) 3 g in sodium chloride 0.9 % 100 mL IVPB     3 g 200 mL/hr over 30 Minutes Intravenous Every 6 hours 08/21/18 1708     08/21/18 1500  piperacillin-tazobactam (ZOSYN) IVPB 3.375 g     3.375 g 100 mL/hr over 30 Minutes Intravenous  Once 08/21/18 1446 08/21/18 1543      Medications   Scheduled Meds: . atorvastatin  80 mg Oral q1800  . enoxaparin (LOVENOX) injection  40 mg Subcutaneous Q12H  . insulin aspart  0-20 Units Subcutaneous TID WC  . insulin aspart  0-5 Units Subcutaneous QHS  . insulin aspart  6 Units Subcutaneous TID WC  . insulin glargine  45 Units Subcutaneous Daily  . mouth rinse  15 mL Mouth Rinse BID   Continuous Infusions: . sodium chloride 250 mL (08/23/18 1227)  . ampicillin-sulbactam (UNASYN) IV 3 g (08/23/18 1229)  . vancomycin     PRN Meds:.sodium chloride, acetaminophen **OR** acetaminophen, morphine injection, ondansetron **OR** ondansetron (ZOFRAN) IV, oxyCODONE-acetaminophen, polyethylene glycol   Data Review:   Micro Results Recent Results (from the past 240 hour(s))  SARS Coronavirus 2 (CEPHEID - Performed in [facility] lab), Hosp Order     Status: None   Collection Time: 08/21/18  3:54 PM  Result Value Ref Range Status   SARS Coronavirus 2 NEGATIVE NEGATIVE Final    Comment: (NOTE) If result is NEGATIVE SARS-CoV-2 target nucleic acids are NOT DETECTED. The SARS-CoV-2 RNA is generally detectable in upper and lower  respiratory specimens during the acute phase of infection. The lowest  concentration of SARS-CoV-2 viral copies this assay can detect is 250  copies / mL. A negative result does not preclude SARS-CoV-2 infection  and should not be used as the sole basis for treatment or other  patient management decisions.  A negative result may occur with  improper specimen collection / handling, submission of  specimen other  than nasopharyngeal swab, presence of viral mutation(s) within the  areas targeted by this assay, and inadequate number of viral copies  (<250 copies / mL). A negative result must be combined with clinical  observations, patient history, and epidemiological information. If result is POSITIVE SARS-CoV-2 target nucleic acids are DETECTED. The SARS-CoV-2 RNA is generally detectable in upper and lower  respiratory specimens dur ing the acute phase of infection.  Positive  results are indicative of active infection with SARS-CoV-2.  Clinical  correlation with patient history and other diagnostic information is  necessary to determine patient infection status.  Positive results do  not rule out bacterial infection or co-infection with other viruses. If result is PRESUMPTIVE POSTIVE SARS-CoV-2 nucleic acids MAY BE PRESENT.   A presumptive positive result was obtained on the submitted specimen  and confirmed on repeat testing.  While 2019 novel coronavirus  (SARS-CoV-2) nucleic acids may be present in the submitted sample  additional confirmatory testing may be necessary for epidemiological  and / or clinical management purposes  to differentiate between  SARS-CoV-2 and other Sarbecovirus currently known to infect humans.  If clinically indicated additional testing with an alternate test  methodology [phone]) is advised. The SARS-CoV-2 RNA is generally  detectable in upper and lower respiratory sp ecimens during the acute  phase of infection. The expected result is Negative. Fact Sheet for Patients:  BoilerBrush.com.cy Fact Sheet for Healthcare Providers: https://pope.com/ This test is not yet approved or cleared by the [place] FDA and has been authorized for detection and/or diagnosis of SARS-CoV-2 by FDA under an Emergency Use Authorization (EUA).  This EUA will remain in effect (meaning this test can be used) for the  duration of the COVID-19 declaration under Section 564(b)(1) of the Act, 21 U.S.C. section 360bbb-3(b)(1), unless the authorization is terminated or revoked sooner. Performed at [facility], [address], [place], [place] [address]     Radiology Reports Ct Abdomen Pelvis Wo Contrast  Result Date: 08/21/2018 CLINICAL DATA:  Abdominal pain and fever. Nausea and vomiting. Right inguinal region swelling. EXAM: CT ABDOMEN AND PELVIS WITHOUT CONTRAST TECHNIQUE: Multidetector CT imaging of the abdomen and pelvis was performed following the standard protocol without oral or IV contrast. COMPARISON:  None. FINDINGS: Lower chest: There is atelectatic change in the lung bases. No lung base edema or consolidation is evident. Hepatobiliary: No focal liver lesions are appreciable on this noncontrast enhanced study. The gallbladder wall is not appreciably thickened. There is no biliary duct dilatation. Pancreas: There is no evident pancreatic mass or inflammatory focus. Spleen: No splenic lesions are evident. Adrenals/Urinary Tract: Adrenals bilaterally appear unremarkable. Kidneys bilaterally show no evident mass or hydronephrosis on either side. There is no evident renal or ureteral calculus on either side. Urinary bladder is midline with wall thickness within normal limits. Stomach/Bowel: There is no appreciable bowel wall or mesenteric thickening. Terminal ileum appears normal. There is no bowel obstruction. There is no evident free air or portal venous air. Vascular/Lymphatic: There is no abdominal aortic aneurysm. No vascular lesions are evident. Reproductive: There are prominent inguinal lymph nodes in the medial lower pelvis and upper thigh region with surrounding soft tissue stranding. The largest lymph node in this area measures 2.4 x 1.5 cm. There is subcutaneous thickening of the lower pelvic/upper thigh region wall in the anterior right lower inguinal region. There is no frank fluid collection  or abscess. No abnormal air in this area. No bowel containing hernia in this area. Elsewhere, there are several borderline prominent inguinal lymph nodes which have central fatty  hila, suggesting benign etiology. There are a few subcentimeter common femoral artery lymph nodes which do not meet size criteria for pathologic significance. No adenopathy based on size criteria noted within the peritoneum or retroperitoneum of the abdomen and pelvis. Other: Appendix appears normal. No abscess or ascites evident in the abdomen or pelvis. Musculoskeletal: No blastic or lytic bone lesions. No intramuscular lesions are evident. IMPRESSION: 1. Localized adenopathy and soft tissue stranding with subcutaneous thickening in the medial right lower inguinal and proximal thigh region extending to the upper scrotal sac. No associated fluid or frank abscess. No air in this area. There is inflammatory change which may be due to a combination of adenitis and panniculitis. No inflammatory change of this nature seen elsewhere on this study 2. No evident bowel obstruction. No abscess in the abdomen or pelvis. Appendix appears normal. No evident hernia. 3. No evident renal or ureteral calculus. No hydronephrosis. Urinary bladder wall thickness is normal. Electronically Signed   By: Bretta Bang III M.D.   On: 08/21/2018 14:37   US Venous Img Lower Unilateral Right  Result Date: 08/21/2018 CLINICAL DATA:  Right lower extremity pain and edema for the past 2 weeks. History of smoking. Evaluate for DVT. EXAM: RIGHT LOWER EXTREMITY VENOUS DOPPLER ULTRASOUND TECHNIQUE: Gray-scale sonography with graded compression, as well as color Doppler and duplex ultrasound were performed to evaluate the lower extremity deep venous systems from the level of the common femoral vein and including the common femoral, femoral, profunda femoral, popliteal and calf veins including the posterior tibial, peroneal and gastrocnemius veins when visible. The  superficial great saphenous vein was also interrogated. Spectral Doppler was utilized to evaluate flow at rest and with distal augmentation maneuvers in the common femoral, femoral and popliteal veins. COMPARISON:  None. FINDINGS: Contralateral Common Femoral Vein: Respiratory phasicity is normal and symmetric with the symptomatic side. No evidence of thrombus. Normal compressibility. Common Femoral Vein: No evidence of thrombus. Normal compressibility, respiratory phasicity and response to augmentation. Saphenofemoral Junction: No evidence of thrombus. Normal compressibility and flow on color Doppler imaging. Profunda Femoral Vein: No evidence of thrombus. Normal compressibility and flow on color Doppler imaging. Femoral Vein: No evidence of thrombus. Normal compressibility, respiratory phasicity and response to augmentation. Popliteal Vein: No evidence of thrombus. Normal compressibility, respiratory phasicity and response to augmentation. Calf Veins: No evidence of thrombus. Normal compressibility and flow on color Doppler imaging. Superficial Great Saphenous Vein: No evidence of thrombus. Normal compressibility. Venous Reflux:  None. Other Findings:  None. IMPRESSION: No evidence of DVT within the right lower extremity. Electronically Signed   By: Simonne Come M.D.   On: 08/21/2018 14:00     CBC Recent Labs  Lab 08/21/18 1253 08/22/18 0316 08/23/18 0242  WBC 10.9* 10.2 9.6  HGB 14.5 13.3 13.7  HCT 42.8 40.1 41.8  PLT 203 185 189  MCV 81.5 82.5 84.8  MCH 27.6 27.4 27.8  MCHC 33.9 33.2 32.8  RDW 14.1 14.0 14.1  LYMPHSABS 1.2  --   --   MONOABS 1.1*  --   --   EOSABS 0.3  --   --   BASOSABS 0.0  --   --     Chemistries  Recent Labs  Lab 08/21/18 1253 08/22/18 0316 08/23/18 0242  NA 129* 137 139  K 4.1 4.0 4.2  CL 93* 106 106  CO2 23 24 26   GLUCOSE 661* 272* 256*  BUN 24* 22* 17  CREATININE 2.04* 1.79* 1.64*  CALCIUM 7.8* 7.7* 8.0*  AST 19  --   --   ALT 22  --   --   ALKPHOS 71   --   --   BILITOT 0.6  --   --    ------------------------------------------------------------------------------------------------------------------ estimated creatinine clearance is 84.9 mL/min (A) (by C-G formula based on SCr of 1.64 mg/dL (H)). ------------------------------------------------------------------------------------------------------------------ Recent Labs    08/21/18 1253  HGBA1C >15.5*   ------------------------------------------------------------------------------------------------------------------ No results for input(s): CHOL, HDL, LDLCALC, TRIG, CHOLHDL, LDLDIRECT in the last 72 hours. ------------------------------------------------------------------------------------------------------------------ No results for input(s): TSH, T4TOTAL, T3FREE, THYROIDAB in the last 72 hours.  Invalid input(s): FREET3 ------------------------------------------------------------------------------------------------------------------ No results for input(s): VITAMINB12, FOLATE, FERRITIN, TIBC, IRON, RETICCTPCT in the last 72 hours.  Coagulation profile No results for input(s): INR, PROTIME in the last 168 hours.  No results for input(s): DDIMER in the last 72 hours.  Cardiac Enzymes No results for input(s): CKMB, TROPONINI, MYOGLOBIN in the last 168 hours.  Invalid input(s): CK ------------------------------------------------------------------------------------------------------------------ Invalid input(s): POCBNP    Assessment & Plan  Patient's 43 year old with diabetes presenting with right groin pain   *Severe hyperglycemia.  No DKA.   Increase Lantus to 45 units Continue pre-meal insulin  *Right groin cellulitis with no air or fluid on the CT scan.   Continue IV vancomycin and Unasyn Urology has seen him they do not feel he needs surgical intervention  *Right shin pain is has some discoloration there likely related to bruising patient continues to complain of  severe symptoms I will order a CT scan of the lower extremity  *Acute kidney injury over CKD stage III.   Improved with IV fluid   *Hypertension.    Hold lisinopril due to elevated creatinine  *Morbid obesity weight loss recommended  *DVT prophylaxis with renally dosed Lovenox.  All the records are reviewed and case discussed with ED provider. Management plans discussed with the patient, family and they are in agreement.  CODE STATUS: Full code      Code Status Orders  (From admission, onward)         Start     Ordered   08/21/18 1632  Full code  Continuous     08/21/18 1631        Code Status History    Date Active Date Inactive Code Status Order ID Comments User Context   08/31/2015 2323 09/01/2015 2035 Full Code [id number]  Arnaldo Natal, MD Inpatient           Consults urology  DVT Prophylaxis  Lovenox  Lab Results  Component Value Date   PLT 189 08/23/2018     Time Spent in minutes 35 minutes  Greater than 50% of time spent in care coordination and counseling patient regarding the condition and plan of care.   Auburn Bilberry M.D on 08/23/2018 at 12:56 PM  Between 7am to 6pm - Pager - [phone]  After 6pm go to [email or web address] - Social research officer, government  [employer]   Office  [phone]

## 2018-08-24 LAB — GLUCOSE, CAPILLARY
Glucose-Capillary: 150 mg/dL — ABNORMAL HIGH (ref 70–99)
Glucose-Capillary: 189 mg/dL — ABNORMAL HIGH (ref 70–99)
Glucose-Capillary: 309 mg/dL — ABNORMAL HIGH (ref 70–99)
Glucose-Capillary: 379 mg/dL — ABNORMAL HIGH (ref 70–99)

## 2018-08-24 MED ORDER — MORPHINE SULFATE (PF) 4 MG/ML IV SOLN
4.0000 mg | INTRAVENOUS | Status: DC | PRN
  Administered 2018-08-26: 4 mg via INTRAVENOUS
  Filled 2018-08-24: qty 1

## 2018-08-24 MED ORDER — PIPERACILLIN-TAZOBACTAM 3.375 G IVPB
3.3750 g | Freq: Three times a day (TID) | INTRAVENOUS | Status: DC
  Administered 2018-08-24 – 2018-08-26 (×8): 3.375 g via INTRAVENOUS
  Filled 2018-08-24 (×9): qty 50

## 2018-08-24 MED ORDER — LISINOPRIL 5 MG PO TABS
10.0000 mg | ORAL_TABLET | Freq: Every day | ORAL | Status: DC
  Administered 2018-08-24: 10 mg via ORAL
  Filled 2018-08-24: qty 2
  Filled 2018-08-24: qty 1

## 2018-08-24 NOTE — Progress Notes (Signed)
[employer] - [place] [facility]                                                                                                                                                                                  Patient Demographics   Micheal Robinson, is a 43 y.o. male, DOB - [birth year], ZOX:[id number]  Admit date - 08/21/2018   Admitting Physician Milagros LollSrikar Sudini, MD  Outpatient Primary [facility] the [facility]   LOS - 3  Subjective: Patient states he is still a lot of pain in the right side of his groin and down his right leg.  He states that his scrotum and penis feel very swollen.  He denies any fevers or chills.    Review of Systems:   CONSTITUTIONAL: No documented fever. No fatigue, weakness. No weight gain, no weight loss.  EYES: No blurry or double vision.  ENT: No tinnitus. No postnasal drip. No redness of the oropharynx.  RESPIRATORY: No cough, no wheeze, no hemoptysis. No dyspnea.  CARDIOVASCULAR: No chest pain. No orthopnea. No palpitations. No syncope.  GASTROINTESTINAL: No nausea, no vomiting or diarrhea. No abdominal pain. No melena or hematochezia.  GENITOURINARY: No dysuria or hematuria.  Positive right groin pain ENDOCRINE: No polyuria or nocturia. No heat or cold intolerance.  HEMATOLOGY: No anemia. No bruising. No bleeding.  INTEGUMENTARY: No rashes. No lesions.  MUSCULOSKELETAL: +Right leg pain. +groin pain. NEUROLOGIC: No numbness, tingling, or ataxia. No seizure-type activity.  PSYCHIATRIC: No anxiety. No insomnia. No ADD.    Vitals:   Vitals:   08/23/18 0500 08/23/18 0734 08/24/18 0428 08/24/18 0807  BP:  (!) 155/92 (!) 184/111 (!) 153/92  Pulse:  97 91 93  Resp:  18 18 17   Temp:  98.9 F (37.2 C)  98.8 F (37.1 C)  TempSrc:    Oral  SpO2:  95% 94% 98%  Weight: (!) 142 kg  (!) 138.9 kg   Height:        Wt Readings from Last 3 Encounters:  08/24/18 (!) 138.9 kg  03/06/17 132.9 kg  07/12/16 (!)  142.9 kg     Intake/Output Summary (Last 24 hours) at 08/24/2018 1309 Last data filed at 08/24/2018 1000 Gross per 24 hour  Intake 1297.27 ml  Output -  Net 1297.27 ml    Physical Exam:   GENERAL: Pleasant-appearing in no apparent distress.  HEENT: Atraumatic, normocephalic. Extraocular muscles are intact. Pupils equal and reactive to light. Sclerae anicteric. No conjunctival injection. No oro-pharyngeal erythema.  NECK: Supple. There is no jugular venous distention. No bruits, no lymphadenopathy, no thyromegaly.  HEART: Regular rate and rhythm,. No murmurs, no  rubs, no clicks.  LUNGS: Clear to auscultation bilaterally. No rales or rhonchi. No wheezes.  ABDOMEN: Soft, flat, nontender, nondistended. Has good bowel sounds. No hepatosplenomegaly appreciated.  GENITOURINARY: right side of scrotum and groin area is indurated and tender to palpation, no fluctuance noted. EXTREMITIES: No evidence of any cyanosis, clubbing, or peripheral edema.  +2 pedal and radial pulses bilaterally.  NEUROLOGIC: The patient is alert, awake, and oriented x3 with no focal motor or sensory deficits appreciated bilaterally.  SKIN: Moist and warm with no rashes appreciated.  Psych: Not anxious, depressed   Antibiotics   Anti-infectives (From admission, onward)   Start     Dose/Rate Route Frequency Ordered Stop   08/23/18 1800  vancomycin (VANCOCIN) 2,000 mg in sodium chloride 0.9 % 500 mL IVPB     2,000 mg 250 mL/hr over 120 Minutes Intravenous Every 24 hours 08/23/18 0716     08/22/18 1800  vancomycin (VANCOCIN) 1,500 mg in sodium chloride 0.9 % 500 mL IVPB  Status:  Discontinued     1,500 mg 250 mL/hr over 120 Minutes Intravenous Every 24 hours 08/21/18 1708 08/23/18 0716   08/21/18 1715  vancomycin (VANCOCIN) 2,500 mg in sodium chloride 0.9 % 500 mL IVPB     2,500 mg 250 mL/hr over 120 Minutes Intravenous  Once 08/21/18 1708 08/21/18 2054   08/21/18 1715  Ampicillin-Sulbactam (UNASYN) 3 g in sodium  chloride 0.9 % 100 mL IVPB     3 g 200 mL/hr over 30 Minutes Intravenous Every 6 hours 08/21/18 1708     08/21/18 1500  piperacillin-tazobactam (ZOSYN) IVPB 3.375 g     3.375 g 100 mL/hr over 30 Minutes Intravenous  Once 08/21/18 1446 08/21/18 1543      Medications   Scheduled Meds: . atorvastatin  80 mg Oral q1800  . enoxaparin (LOVENOX) injection  40 mg Subcutaneous Q12H  . insulin aspart  0-20 Units Subcutaneous TID WC  . insulin aspart  0-5 Units Subcutaneous QHS  . insulin aspart  6 Units Subcutaneous TID WC  . insulin glargine  45 Units Subcutaneous Daily  . lisinopril  10 mg Oral Daily  . mouth rinse  15 mL Mouth Rinse BID   Continuous Infusions: . sodium chloride 250 mL (08/23/18 2330)  . ampicillin-sulbactam (UNASYN) IV 3 g (08/24/18 0855)  . vancomycin 250 mL/hr at 08/23/18 1806   PRN Meds:.sodium chloride, acetaminophen **OR** acetaminophen, morphine injection, ondansetron **OR** ondansetron (ZOFRAN) IV, oxyCODONE-acetaminophen, polyethylene glycol   Data Review:   Micro Results Recent Results (from the past 240 hour(s))  SARS Coronavirus 2 (CEPHEID - Performed in [facility] hospital lab), Hosp Order     Status: None   Collection Time: 08/21/18  3:54 PM  Result Value Ref Range Status   SARS Coronavirus 2 NEGATIVE NEGATIVE Final    Comment: (NOTE) If result is NEGATIVE SARS-CoV-2 target nucleic acids are NOT DETECTED. The SARS-CoV-2 RNA is generally detectable in upper and lower  respiratory specimens during the acute phase of infection. The lowest  concentration of SARS-CoV-2 viral copies this assay can detect is 250  copies / mL. A negative result does not preclude SARS-CoV-2 infection  and should not be used as the sole basis for treatment or other  patient management decisions.  A negative result may occur with  improper specimen collection / handling, submission of specimen other  than nasopharyngeal swab, presence of viral mutation(s) within the  areas  targeted by this assay, and inadequate number of viral copies  (<250  copies / mL). A negative result must be combined with clinical  observations, patient history, and epidemiological information. If result is POSITIVE SARS-CoV-2 target nucleic acids are DETECTED. The SARS-CoV-2 RNA is generally detectable in upper and lower  respiratory specimens dur ing the acute phase of infection.  Positive  results are indicative of active infection with SARS-CoV-2.  Clinical  correlation with patient history and other diagnostic information is  necessary to determine patient infection status.  Positive results do  not rule out bacterial infection or co-infection with other viruses. If result is PRESUMPTIVE POSTIVE SARS-CoV-2 nucleic acids MAY BE PRESENT.   A presumptive positive result was obtained on the submitted specimen  and confirmed on repeat testing.  While 2019 novel coronavirus  (SARS-CoV-2) nucleic acids may be present in the submitted sample  additional confirmatory testing may be necessary for epidemiological  and / or clinical management purposes  to differentiate between  SARS-CoV-2 and other Sarbecovirus currently known to infect humans.  If clinically indicated additional testing with an alternate test  methodology [id number]) is advised. The SARS-CoV-2 RNA is generally  detectable in upper and lower respiratory sp ecimens during the acute  phase of infection. The expected result is Negative. Fact Sheet for Patients:  BoilerBrush.com.cy Fact Sheet for Healthcare Providers: https://pope.com/ This test is not yet approved or cleared by the [place] FDA and has been authorized for detection and/or diagnosis of SARS-CoV-2 by FDA under an Emergency Use Authorization (EUA).  This EUA will remain in effect (meaning this test can be used) for the duration of the COVID-19 declaration under Section 564(b)(1) of the Act, 21 U.S.C. section  360bbb-3(b)(1), unless the authorization is terminated or revoked sooner. Performed at [facility], [address], [place], [place] [address]     Radiology Reports Ct Abdomen Pelvis Wo Contrast  Result Date: 08/21/2018 CLINICAL DATA:  Abdominal pain and fever. Nausea and vomiting. Right inguinal region swelling. EXAM: CT ABDOMEN AND PELVIS WITHOUT CONTRAST TECHNIQUE: Multidetector CT imaging of the abdomen and pelvis was performed following the standard protocol without oral or IV contrast. COMPARISON:  None. FINDINGS: Lower chest: There is atelectatic change in the lung bases. No lung base edema or consolidation is evident. Hepatobiliary: No focal liver lesions are appreciable on this noncontrast enhanced study. The gallbladder wall is not appreciably thickened. There is no biliary duct dilatation. Pancreas: There is no evident pancreatic mass or inflammatory focus. Spleen: No splenic lesions are evident. Adrenals/Urinary Tract: Adrenals bilaterally appear unremarkable. Kidneys bilaterally show no evident mass or hydronephrosis on either side. There is no evident renal or ureteral calculus on either side. Urinary bladder is midline with wall thickness within normal limits. Stomach/Bowel: There is no appreciable bowel wall or mesenteric thickening. Terminal ileum appears normal. There is no bowel obstruction. There is no evident free air or portal venous air. Vascular/Lymphatic: There is no abdominal aortic aneurysm. No vascular lesions are evident. Reproductive: There are prominent inguinal lymph nodes in the medial lower pelvis and upper thigh region with surrounding soft tissue stranding. The largest lymph node in this area measures 2.4 x 1.5 cm. There is subcutaneous thickening of the lower pelvic/upper thigh region wall in the anterior right lower inguinal region. There is no frank fluid collection or abscess. No abnormal air in this area. No bowel containing hernia in this area. Elsewhere,  there are several borderline prominent inguinal lymph nodes which have central fatty hila, suggesting benign etiology. There are a few subcentimeter common femoral artery lymph  nodes which do not meet size criteria for pathologic significance. No adenopathy based on size criteria noted within the peritoneum or retroperitoneum of the abdomen and pelvis. Other: Appendix appears normal. No abscess or ascites evident in the abdomen or pelvis. Musculoskeletal: No blastic or lytic bone lesions. No intramuscular lesions are evident. IMPRESSION: 1. Localized adenopathy and soft tissue stranding with subcutaneous thickening in the medial right lower inguinal and proximal thigh region extending to the upper scrotal sac. No associated fluid or frank abscess. No air in this area. There is inflammatory change which may be due to a combination of adenitis and panniculitis. No inflammatory change of this nature seen elsewhere on this study 2. No evident bowel obstruction. No abscess in the abdomen or pelvis. Appendix appears normal. No evident hernia. 3. No evident renal or ureteral calculus. No hydronephrosis. Urinary bladder wall thickness is normal. Electronically Signed   By: Bretta BangWilliam  Woodruff III M.D.   On: 08/21/2018 14:37   Ct Tibia Fibula Right Wo Contrast  Result Date: 08/23/2018 CLINICAL DATA:  Right shin pain and discoloration in a diabetic patient with severe hyperglycemia. Patient admitted to the hospital for right groin cellulitis 08/21/2018. EXAM: CT OF THE LOWER RIGHT EXTREMITY WITHOUT CONTRAST TECHNIQUE: Multidetector CT imaging of the right lower extremity was performed according to the standard protocol. COMPARISON:  None. FINDINGS: Bones/Joint/Cartilage No fracture. No periosteal reaction or bony destructive change. Small bone island in the lateral epicondyle of the distal femur incidentally noted. Osteochondral lesion of the medial talar dome measures 0.6 cm transverse by 1.1 cm AP. Ligaments Suboptimally  assessed by CT. Muscles and Tendons Intact. No intramuscular fluid collection or gas within muscle or tracking along fascial planes is identified. No fluid collection or evidence of muscular edema. Soft tissues There is some subcutaneous edema about the lower leg. No soft tissue gas or focal fluid collection. No radiopaque foreign body. IMPRESSION: Subcutaneous edema about the lower leg could be due to cellulitis or dependent change. Negative for abscess. No evidence of osteomyelitis, myositis or fasciitis. Osteochondral lesion medial talar dome. Electronically Signed   By: Drusilla Kannerhomas  Dalessio M.D.   On: 08/23/2018 15:43   [place] Venous Img Lower Unilateral Right  Result Date: 08/21/2018 CLINICAL DATA:  Right lower extremity pain and edema for the past 2 weeks. History of smoking. Evaluate for DVT. EXAM: RIGHT LOWER EXTREMITY VENOUS DOPPLER ULTRASOUND TECHNIQUE: Gray-scale sonography with graded compression, as well as color Doppler and duplex ultrasound were performed to evaluate the lower extremity deep venous systems from the level of the common femoral vein and including the common femoral, femoral, profunda femoral, popliteal and calf veins including the posterior tibial, peroneal and gastrocnemius veins when visible. The superficial great saphenous vein was also interrogated. Spectral Doppler was utilized to evaluate flow at rest and with distal augmentation maneuvers in the common femoral, femoral and popliteal veins. COMPARISON:  None. FINDINGS: Contralateral Common Femoral Vein: Respiratory phasicity is normal and symmetric with the symptomatic side. No evidence of thrombus. Normal compressibility. Common Femoral Vein: No evidence of thrombus. Normal compressibility, respiratory phasicity and response to augmentation. Saphenofemoral Junction: No evidence of thrombus. Normal compressibility and flow on color Doppler imaging. Profunda Femoral Vein: No evidence of thrombus. Normal compressibility and flow on color  Doppler imaging. Femoral Vein: No evidence of thrombus. Normal compressibility, respiratory phasicity and response to augmentation. Popliteal Vein: No evidence of thrombus. Normal compressibility, respiratory phasicity and response to augmentation. Calf Veins: No evidence of thrombus. Normal compressibility and flow on  color Doppler imaging. Superficial Great Saphenous Vein: No evidence of thrombus. Normal compressibility. Venous Reflux:  None. Other Findings:  None. IMPRESSION: No evidence of DVT within the right lower extremity. Electronically Signed   By: Simonne ComeJohn  Watts M.D.   On: 08/21/2018 14:00     CBC Recent Labs  Lab 08/21/18 1253 08/22/18 0316 08/23/18 0242  WBC 10.9* 10.2 9.6  HGB 14.5 13.3 13.7  HCT 42.8 40.1 41.8  PLT 203 185 189  MCV 81.5 82.5 84.8  MCH 27.6 27.4 27.8  MCHC 33.9 33.2 32.8  RDW 14.1 14.0 14.1  LYMPHSABS 1.2  --   --   MONOABS 1.1*  --   --   EOSABS 0.3  --   --   BASOSABS 0.0  --   --     Chemistries  Recent Labs  Lab 08/21/18 1253 08/22/18 0316 08/23/18 0242  NA 129* 137 139  K 4.1 4.0 4.2  CL 93* 106 106  CO2 23 24 26   GLUCOSE 661* 272* 256*  BUN 24* 22* 17  CREATININE 2.04* 1.79* 1.64*  CALCIUM 7.8* 7.7* 8.0*  AST 19  --   --   ALT 22  --   --   ALKPHOS 71  --   --   BILITOT 0.6  --   --    ------------------------------------------------------------------------------------------------------------------ estimated creatinine clearance is 83.9 mL/min (A) (by C-G formula based on SCr of 1.64 mg/dL (H)). ------------------------------------------------------------------------------------------------------------------ No results for input(s): HGBA1C in the last 72 hours. ------------------------------------------------------------------------------------------------------------------ No results for input(s): CHOL, HDL, LDLCALC, TRIG, CHOLHDL, LDLDIRECT in the last 72  hours. ------------------------------------------------------------------------------------------------------------------ No results for input(s): TSH, T4TOTAL, T3FREE, THYROIDAB in the last 72 hours.  Invalid input(s): FREET3 ------------------------------------------------------------------------------------------------------------------ No results for input(s): VITAMINB12, FOLATE, FERRITIN, TIBC, IRON, RETICCTPCT in the last 72 hours.  Coagulation profile No results for input(s): INR, PROTIME in the last 168 hours.  No results for input(s): DDIMER in the last 72 hours.  Cardiac Enzymes No results for input(s): CKMB, TROPONINI, MYOGLOBIN in the last 168 hours.  Invalid input(s): CK ------------------------------------------------------------------------------------------------------------------ Invalid input(s): POCBNP    Assessment & Plan  Patient's 43 year old with diabetes presenting with right groin pain  Right groin cellulitis with no air or fluid on the CT scan. No signs of sepsis. -Continue IV vancomycin. Switch unasyn to zosyn due to lack of improvement.  -Seen by urology, who recommended scrotal support and IV antibiotics  Severe hyperglycemia in type 2 diabetes- blood sugars improving today. -Continue Lantus to 45 units -Resistant SSI  Right shin pain- likely related to his right groin cellulitis.  -CT of right tib/fib with subcutaneous edema in the lower leg but can be cellulitis or dependent change -Continue IV abx as above  CKD stage III- initially with AKI, but his has resolved. -Start lisinopril today -Recheck creatinine in the morning  Hypertension-BPs have been elevated -Restart lisinopril  Morbid obesity- weight loss recommended  DVT prophylaxis- lovenox  All the records are reviewed and case discussed with ED provider. Management plans discussed with the patient, family and they are in agreement.  CODE STATUS: Full code      Code Status  Orders  (From admission, onward)         Start     Ordered   08/21/18 1632  Full code  Continuous     08/21/18 1631        Code Status History    Date Active Date Inactive Code Status Order ID Comments User Context   08/31/2015  2323 09/01/2015 2035 Full Code [id number]  Harrie Foreman, MD Inpatient      Consults urology  DVT Prophylaxis  Lovenox  Lab Results  Component Value Date   PLT 189 08/23/2018     Time Spent in minutes 35 minutes  Greater than 50% of time spent in care coordination and counseling patient regarding the condition and plan of care.   Berna Spare Mayo M.D on 08/24/2018 at 1:09 PM  Between 7am to 6pm - Pager - [phone]  After 6pm go to [email or web address] - Proofreader  [employer]   Office  [phone]

## 2018-08-24 NOTE — Progress Notes (Signed)
Pt reports he has been up to the bathroom to urinate several times this shift.  He declines to use a urinal.

## 2018-08-24 NOTE — Consult Note (Signed)
Pharmacy Antibiotic Note  Micheal Robinson. is a 43 y.o. male admitted on 08/21/2018 with cellulitis.  Pharmacy has been consulted for Zosyn and vancomycin dosing. He was originally started on vancomycin and Unasyn. Since admission his leucocytosis has resolved and he has been afebrile but he remains with a significant amount of pain and swelling. Renal function has improved but not yet back to baseline SCr 1.5 mg/dL  Plan: 1) continue vancomycin 2000 mg every 24 hours  Goal AUC 400-550. Expected AUC: 502 SCr used: 1.64  2) start Zosyn 3.375g IV q8h (4 hour infusion) SCr ordered for tomorrow  Height: 6' (182.9 cm) Weight: (!) 306 lb 3.2 oz (138.9 kg) IBW/kg (Calculated) : 77.6  Temp (24hrs), Avg:98.8 F (37.1 C), Min:98.8 F (37.1 C), Max:98.8 F (37.1 C)  Recent Labs  Lab 08/21/18 1253 08/21/18 2219 08/22/18 0316 08/23/18 0242  WBC 10.9*  --  10.2 9.6  CREATININE 2.04*  --  1.79* 1.64*  LATICACIDVEN  --  2.4*  --   --     Estimated Creatinine Clearance: 83.9 mL/min (A) (by C-G formula based on SCr of 1.64 mg/dL (H)).    Antimicrobials this admission: Zosyn 6/6 >>  vancomycin 6/5 >> Unasyn 6/5 >> 6/6  Microbiology results: 6/5 SARS CoV-2: negative   Thank you for allowing pharmacy to be a part of this patient's care.  Dallie Piles, PharmD 08/24/2018 1:26 PM

## 2018-08-25 ENCOUNTER — Encounter: Payer: Self-pay | Admitting: Anesthesiology

## 2018-08-25 ENCOUNTER — Encounter
Admission: EM | Discharge status: Against Medical Advice | Payer: Self-pay | Source: Home / Self Care | Attending: Internal Medicine

## 2018-08-25 ENCOUNTER — Inpatient Hospital Stay: Payer: Medicaid Other

## 2018-08-25 ENCOUNTER — Inpatient Hospital Stay: Payer: Medicaid Other | Admitting: Certified Registered"

## 2018-08-25 DIAGNOSIS — L02214 Cutaneous abscess of groin: Secondary | ICD-10-CM

## 2018-08-25 DIAGNOSIS — R739 Hyperglycemia, unspecified: Secondary | ICD-10-CM

## 2018-08-25 HISTORY — PX: INCISION AND DRAINAGE ABSCESS: SHX5864

## 2018-08-25 LAB — URINE DRUG SCREEN, QUALITATIVE (ARMC ONLY)
Amphetamines, Ur Screen: NOT DETECTED
Barbiturates, Ur Screen: NOT DETECTED
Benzodiazepine, Ur Scrn: NOT DETECTED
Cannabinoid 50 Ng, Ur ~~LOC~~: NOT DETECTED
Cocaine Metabolite,Ur ~~LOC~~: NOT DETECTED
MDMA (Ecstasy)Ur Screen: NOT DETECTED
Methadone Scn, Ur: NOT DETECTED
Opiate, Ur Screen: NOT DETECTED
Phencyclidine (PCP) Ur S: NOT DETECTED
Tricyclic, Ur Screen: NOT DETECTED

## 2018-08-25 LAB — CBC
HCT: 40.1 % (ref 39.0–52.0)
Hemoglobin: 13.2 g/dL (ref 13.0–17.0)
MCH: 27.3 pg (ref 26.0–34.0)
MCHC: 32.9 g/dL (ref 30.0–36.0)
MCV: 82.9 fL (ref 80.0–100.0)
Platelets: 217 10*3/uL (ref 150–400)
RBC: 4.84 MIL/uL (ref 4.22–5.81)
RDW: 13.8 % (ref 11.5–15.5)
WBC: 7.4 10*3/uL (ref 4.0–10.5)
nRBC: 0 % (ref 0.0–0.2)

## 2018-08-25 LAB — BASIC METABOLIC PANEL
Anion gap: 7 (ref 5–15)
BUN: 13 mg/dL (ref 6–20)
CO2: 26 mmol/L (ref 22–32)
Calcium: 8.6 mg/dL — ABNORMAL LOW (ref 8.9–10.3)
Chloride: 103 mmol/L (ref 98–111)
Creatinine, Ser: 1.64 mg/dL — ABNORMAL HIGH (ref 0.61–1.24)
GFR calc Af Amer: 58 mL/min — ABNORMAL LOW (ref >60)
GFR calc non Af Amer: 50 mL/min — ABNORMAL LOW (ref >60)
Glucose, Bld: 305 mg/dL — ABNORMAL HIGH (ref 70–99)
Potassium: 4.2 mmol/L (ref 3.5–5.1)
Sodium: 136 mmol/L (ref 135–145)

## 2018-08-25 LAB — LACTIC ACID, PLASMA: Lactic Acid, Venous: 1.1 mmol/L (ref 0.5–1.9)

## 2018-08-25 LAB — GLUCOSE, CAPILLARY
Glucose-Capillary: 261 mg/dL — ABNORMAL HIGH (ref 70–99)
Glucose-Capillary: 323 mg/dL — ABNORMAL HIGH (ref 70–99)
Glucose-Capillary: 270 mg/dL — ABNORMAL HIGH (ref 70–99)
Glucose-Capillary: 232 mg/dL — ABNORMAL HIGH (ref 70–99)
Glucose-Capillary: 279 mg/dL — ABNORMAL HIGH (ref 70–99)
Glucose-Capillary: 267 mg/dL — ABNORMAL HIGH (ref 70–99)

## 2018-08-25 LAB — MRSA PCR SCREENING: MRSA by PCR: NEGATIVE

## 2018-08-25 SURGERY — INCISION AND DRAINAGE, ABSCESS
Anesthesia: General | Laterality: Right

## 2018-08-25 MED ORDER — ALBUTEROL SULFATE HFA 108 (90 BASE) MCG/ACT IN AERS
INHALATION_SPRAY | RESPIRATORY_TRACT | Status: DC | PRN
  Administered 2018-08-25 (×2): 6 via RESPIRATORY_TRACT

## 2018-08-25 MED ORDER — LIDOCAINE HCL (CARDIAC) PF 100 MG/5ML IV SOSY
PREFILLED_SYRINGE | INTRAVENOUS | Status: DC | PRN
  Administered 2018-08-25: 100 mg via INTRAVENOUS

## 2018-08-25 MED ORDER — INSULIN ASPART 100 UNIT/ML ~~LOC~~ SOLN
SUBCUTANEOUS | Status: AC
  Filled 2018-08-25: qty 1

## 2018-08-25 MED ORDER — MIDAZOLAM HCL 2 MG/2ML IJ SOLN
INTRAMUSCULAR | Status: DC | PRN
  Administered 2018-08-25: 2 mg via INTRAVENOUS

## 2018-08-25 MED ORDER — PROPOFOL 10 MG/ML IV BOLUS
INTRAVENOUS | Status: DC | PRN
  Administered 2018-08-25: 50 mg via INTRAVENOUS
  Administered 2018-08-25: 170 mg via INTRAVENOUS
  Administered 2018-08-25: 50 mg via INTRAVENOUS

## 2018-08-25 MED ORDER — INSULIN GLARGINE 100 UNIT/ML ~~LOC~~ SOLN
50.0000 [IU] | Freq: Every day | SUBCUTANEOUS | Status: DC
  Administered 2018-08-26: 50 [IU] via SUBCUTANEOUS
  Filled 2018-08-25 (×3): qty 0.5

## 2018-08-25 MED ORDER — SUCCINYLCHOLINE CHLORIDE 20 MG/ML IJ SOLN
INTRAMUSCULAR | Status: DC | PRN
  Administered 2018-08-25: 120 mg via INTRAVENOUS

## 2018-08-25 MED ORDER — FENTANYL CITRATE (PF) 100 MCG/2ML IJ SOLN: 25.0000 ug | INTRAMUSCULAR | Status: DC | PRN

## 2018-08-25 MED ORDER — LACTATED RINGERS IV SOLN
INTRAVENOUS | Status: DC | PRN
  Administered 2018-08-25: 17:00:00 via INTRAVENOUS

## 2018-08-25 MED ORDER — HYDRALAZINE HCL 20 MG/ML IJ SOLN
10.0000 mg | Freq: Once | INTRAMUSCULAR | Status: AC
  Administered 2018-08-25: 10 mg via INTRAVENOUS

## 2018-08-25 MED ORDER — FUROSEMIDE 10 MG/ML IJ SOLN
INTRAMUSCULAR | Status: AC
  Administered 2018-08-25: 40 mg
  Filled 2018-08-25: qty 4

## 2018-08-25 MED ORDER — PROPOFOL 10 MG/ML IV BOLUS
INTRAVENOUS | Status: AC
  Filled 2018-08-25: qty 40

## 2018-08-25 MED ORDER — FENTANYL CITRATE (PF) 100 MCG/2ML IJ SOLN
INTRAMUSCULAR | Status: AC
  Filled 2018-08-25: qty 2

## 2018-08-25 MED ORDER — FUROSEMIDE 10 MG/ML PO SOLN
40.0000 mg | Freq: Once | ORAL | Status: DC
  Filled 2018-08-25: qty 4

## 2018-08-25 MED ORDER — LIDOCAINE-EPINEPHRINE (PF) 1 %-1:200000 IJ SOLN
INTRAMUSCULAR | Status: AC
  Filled 2018-08-25: qty 30

## 2018-08-25 MED ORDER — LIDOCAINE-EPINEPHRINE 1 %-1:100000 IJ SOLN
10.0000 mL | Freq: Once | INTRAMUSCULAR | Status: DC
  Filled 2018-08-25: qty 10

## 2018-08-25 MED ORDER — INSULIN ASPART 100 UNIT/ML ~~LOC~~ SOLN
8.0000 [IU] | Freq: Once | SUBCUTANEOUS | Status: AC
  Administered 2018-08-25: 8 [IU] via SUBCUTANEOUS

## 2018-08-25 MED ORDER — TAMSULOSIN HCL 0.4 MG PO CAPS
0.4000 mg | ORAL_CAPSULE | Freq: Every day | ORAL | Status: DC
  Administered 2018-08-26: 0.4 mg via ORAL
  Filled 2018-08-25: qty 1

## 2018-08-25 MED ORDER — OXYCODONE HCL 5 MG PO TABS: 5.0000 mg | ORAL_TABLET | Freq: Once | ORAL | Status: DC | PRN

## 2018-08-25 MED ORDER — PHENYLEPHRINE HCL (PRESSORS) 10 MG/ML IV SOLN
INTRAVENOUS | Status: DC | PRN
  Administered 2018-08-25: 100 ug via INTRAVENOUS

## 2018-08-25 MED ORDER — OXYCODONE HCL 5 MG/5ML PO SOLN: 5.0000 mg | Freq: Once | ORAL | Status: DC | PRN

## 2018-08-25 MED ORDER — FENTANYL CITRATE (PF) 100 MCG/2ML IJ SOLN
INTRAMUSCULAR | Status: DC | PRN
  Administered 2018-08-25: 50 ug via INTRAVENOUS

## 2018-08-25 MED ORDER — LIDOCAINE-EPINEPHRINE (PF) 1 %-1:200000 IJ SOLN
INTRAMUSCULAR | Status: DC | PRN
  Administered 2018-08-25: 17 mL

## 2018-08-25 MED ORDER — HALOPERIDOL LACTATE 5 MG/ML IJ SOLN
INTRAMUSCULAR | Status: AC
  Filled 2018-08-25: qty 1

## 2018-08-25 MED ORDER — HYDRALAZINE HCL 20 MG/ML IJ SOLN
INTRAMUSCULAR | Status: AC
  Filled 2018-08-25: qty 1

## 2018-08-25 MED ORDER — MIDAZOLAM HCL 2 MG/2ML IJ SOLN
INTRAMUSCULAR | Status: AC
  Filled 2018-08-25: qty 2

## 2018-08-25 MED ORDER — DEXMEDETOMIDINE HCL IN NACL 400 MCG/100ML IV SOLN
0.0000 ug/kg/h | INTRAVENOUS | Status: DC
  Administered 2018-08-25: 0.2 ug/kg/h via INTRAVENOUS

## 2018-08-25 MED ORDER — HALOPERIDOL LACTATE 5 MG/ML IJ SOLN
10.0000 mg | Freq: Once | INTRAMUSCULAR | Status: AC
  Administered 2018-08-25: 10 mg via INTRAVENOUS

## 2018-08-25 SURGICAL SUPPLY — 23 items
BNDG GAUZE 4.5X4.1 6PLY STRL (MISCELLANEOUS) ×3 IMPLANT
CANISTER SUCT 1200ML W/VALVE (MISCELLANEOUS) ×3 IMPLANT
CHLORAPREP W/TINT 26 (MISCELLANEOUS) ×3 IMPLANT
DRAPE LAPAROTOMY 77X122 PED (DRAPES) ×3 IMPLANT
DRAPE LEGGINS SURG 28X43 STRL (DRAPES) ×3 IMPLANT
DRAPE UNDER BUTTOCK W/FLU (DRAPES) ×3 IMPLANT
DRSG GAUZE FLUFF 36X18 (GAUZE/BANDAGES/DRESSINGS) ×3 IMPLANT
ELECT REM PT RETURN 9FT ADLT (ELECTROSURGICAL) ×3
ELECTRODE REM PT RTRN 9FT ADLT (ELECTROSURGICAL) ×1 IMPLANT
GLOVE BIO SURGEON STRL SZ7 (GLOVE) ×3 IMPLANT
GLOVE BIO SURGEON STRL SZ7.5 (GLOVE) ×3 IMPLANT
GOWN STRL REUS W/ TWL LRG LVL3 (GOWN DISPOSABLE) ×2 IMPLANT
GOWN STRL REUS W/TWL LRG LVL3 (GOWN DISPOSABLE) ×4
JELLY LUB 2OZ STRL (MISCELLANEOUS) ×2
JELLY LUBE 2OZ STRL (MISCELLANEOUS) ×1 IMPLANT
KIT TURNOVER KIT A (KITS) ×3 IMPLANT
NEEDLE HYPO 25X1 1.5 SAFETY (NEEDLE) ×3 IMPLANT
NS IRRIG 500ML POUR BTL (IV SOLUTION) ×3 IMPLANT
PACK BASIN MINOR ARMC (MISCELLANEOUS) ×3 IMPLANT
SOL PREP PVP 2OZ (MISCELLANEOUS) ×3
SOLUTION PREP PVP 2OZ (MISCELLANEOUS) ×1 IMPLANT
SWAB CULTURE AMIES ANAERIB BLU (MISCELLANEOUS) ×3 IMPLANT
SYR 10ML LL (SYRINGE) ×6 IMPLANT

## 2018-08-25 NOTE — Progress Notes (Signed)
Pt very upset this morning, requesting that "his doctor comes to his room now, stating that we aren't doing anything for him". MD Mayo notified.

## 2018-08-25 NOTE — Progress Notes (Signed)
Pt refusing bed alarm.

## 2018-08-25 NOTE — Progress Notes (Addendum)
I rounded this evening and checked on the patient.  He is resting comfortably in the ICU. Appreciate PCCM management.  He is resting comfortably but arousable.  Right inguinal dressing in place and intact.  Patient in no acute distress.  Nurse reports he received Precedex.   Plan dressing change tomorrow.  Foley can remain in place until he is alert and ambulatory.

## 2018-08-25 NOTE — Progress Notes (Signed)
[employer] - [place] at [facility]                                                                                                                                                                                  Patient Demographics   Micheal Robinson, is a 43 y.o. male, DOB - [birth year], XBM:[id number]  Admit date - 08/21/2018   Admitting Physician Milagros Loll, MD  Outpatient Primary MD for the patient is [facility]   LOS - 4  Subjective: Patient is very upset this morning, as he feels that there is nothing being done for his groin swelling.  He feels that the swelling over his pubic bone is continuing to get worse.  He has also noted worsening scrotal edema.  He denies any fevers or chills.  He denies any skin breakdown or drainage.  Review of Systems:   CONSTITUTIONAL: No documented fever. No fatigue, weakness. No weight gain, no weight loss.  EYES: No blurry or double vision.  ENT: No tinnitus. No postnasal drip. No redness of the oropharynx.  RESPIRATORY: No cough, no wheeze, no hemoptysis. No dyspnea.  CARDIOVASCULAR: No chest pain. No orthopnea. No palpitations. No syncope.  GASTROINTESTINAL: No nausea, no vomiting or diarrhea. No abdominal pain. No melena or hematochezia.  GENITOURINARY: No dysuria or hematuria.  Positive right groin pain ENDOCRINE: No polyuria or nocturia. No heat or cold intolerance.  HEMATOLOGY: No anemia. No bruising. No bleeding.  INTEGUMENTARY: No rashes. No lesions.  MUSCULOSKELETAL: +Right leg pain. +groin pain. + groin swelling. NEUROLOGIC: No numbness, tingling, or ataxia. No seizure-type activity.  PSYCHIATRIC: No anxiety. No insomnia. No ADD.    Vitals:   Vitals:   08/24/18 0807 08/24/18 1632 08/25/18 0034 08/25/18 0801  BP: (!) 153/92 138/89 (!) 147/88   Pulse: 93 99 95 96  Resp: Temp: 98.8 F (37.1 C) 97.8 F (36.6 C) 98.1 F (36.7 C) 98.4 F (36.9 C)  TempSrc: Oral Axillary Oral  Oral  SpO2: 98% 92% 91% 98%  Weight:   (!) 137.3 kg   Height:        Wt Readings from Last 3 Encounters:  08/25/18 (!) 137.3 kg  03/06/17 132.9 kg  07/12/16 (!) 142.9 kg     Intake/Output Summary (Last 24 hours) at 08/25/2018 1406 Last data filed at 08/24/2018 1857 Gross per 24 hour  Intake 240 ml  Output -  Net 240 ml    Physical Exam:   GENERAL: Pleasant-appearing in no apparent distress.  HEENT: Atraumatic, normocephalic. Extraocular muscles are intact. Pupils equal and reactive to light. Sclerae anicteric. No conjunctival injection. No oro-pharyngeal erythema.  NECK:  Supple. There is no jugular venous distention. No bruits, no lymphadenopathy, no thyromegaly.  HEART: Regular rate and rhythm,. No murmurs, no rubs, no clicks.  LUNGS: Clear to auscultation bilaterally. No rales or rhonchi. No wheezes.  ABDOMEN: Soft, flat, nontender, nondistended. Has good bowel sounds. No hepatosplenomegaly appreciated.  GENITOURINARY: Right inguinal area over the region of the mons pubis is indurated with some new fluctuance noted today. +scrotal edema present. EXTREMITIES: No evidence of any cyanosis, clubbing, or peripheral edema.  +2 pedal and radial pulses bilaterally.  NEUROLOGIC: The patient is alert, awake, and oriented x3 with no focal motor or sensory deficits appreciated bilaterally.  SKIN: Moist and warm with no rashes appreciated.  Psych: Not anxious, depressed   Antibiotics   Anti-infectives (From admission, onward)   Start     Dose/Rate Route Frequency Ordered Stop   08/24/18 1600  piperacillin-tazobactam (ZOSYN) IVPB 3.375 g     3.375 g 12.5 mL/hr over 240 Minutes Intravenous Every 8 hours 08/24/18 1340     08/23/18 1800  vancomycin (VANCOCIN) 2,000 mg in sodium chloride 0.9 % 500 mL IVPB     2,000 mg 250 mL/hr over 120 Minutes Intravenous Every 24 hours 08/23/18 0716     08/22/18 1800  vancomycin (VANCOCIN) 1,500 mg in sodium chloride 0.9 % 500 mL IVPB  Status:   Discontinued     1,500 mg 250 mL/hr over 120 Minutes Intravenous Every 24 hours 08/21/18 1708 08/23/18 0716   08/21/18 1715  vancomycin (VANCOCIN) 2,500 mg in sodium chloride 0.9 % 500 mL IVPB     2,500 mg 250 mL/hr over 120 Minutes Intravenous  Once 08/21/18 1708 08/21/18 2054   08/21/18 1715  Ampicillin-Sulbactam (UNASYN) 3 g in sodium chloride 0.9 % 100 mL IVPB  Status:  Discontinued     3 g 200 mL/hr over 30 Minutes Intravenous Every 6 hours 08/21/18 1708 08/24/18 1317   08/21/18 1500  piperacillin-tazobactam (ZOSYN) IVPB 3.375 g     3.375 g 100 mL/hr over 30 Minutes Intravenous  Once 08/21/18 1446 08/21/18 1543      Medications   Scheduled Meds: . atorvastatin  80 mg Oral q1800  . enoxaparin (LOVENOX) injection  40 mg Subcutaneous Q12H  . insulin aspart  0-20 Units Subcutaneous TID WC  . insulin aspart  0-5 Units Subcutaneous QHS  . insulin aspart  6 Units Subcutaneous TID WC  . insulin glargine  45 Units Subcutaneous Daily  . lidocaine-EPINEPHrine  10 mL Intradermal Once  . lisinopril  10 mg Oral Daily  . mouth rinse  15 mL Mouth Rinse BID   Continuous Infusions: . sodium chloride 250 mL (08/23/18 2330)  . piperacillin-tazobactam (ZOSYN)  IV 3.375 g (08/25/18 0600)  . vancomycin 2,000 mg (08/24/18 1722)   PRN Meds:.sodium chloride, acetaminophen **OR** acetaminophen, morphine injection, ondansetron **OR** ondansetron (ZOFRAN) IV, oxyCODONE-acetaminophen, polyethylene glycol   Data Review:   Micro Results Recent Results (from the past 240 hour(s))  SARS Coronavirus 2 (CEPHEID - Performed in [place] hospital lab), Hosp Order     Status: None   Collection Time: 08/21/18  3:54 PM  Result Value Ref Range Status   SARS Coronavirus 2 NEGATIVE NEGATIVE Final    Comment: (NOTE) If result is NEGATIVE SARS-CoV-2 target nucleic acids are NOT DETECTED. The SARS-CoV-2 RNA is generally detectable in upper and lower  respiratory specimens during the acute phase of infection.  The lowest  concentration of SARS-CoV-2 viral copies this assay can detect is 250  copies /  mL. A negative result does not preclude SARS-CoV-2 infection  and should not be used as the sole basis for treatment or other  patient management decisions.  A negative result may occur with  improper specimen collection / handling, submission of specimen other  than nasopharyngeal swab, presence of viral mutation(s) within the  areas targeted by this assay, and inadequate number of viral copies  (<250 copies / mL). A negative result must be combined with clinical  observations, patient history, and epidemiological information. If result is POSITIVE SARS-CoV-2 target nucleic acids are DETECTED. The SARS-CoV-2 RNA is generally detectable in upper and lower  respiratory specimens dur ing the acute phase of infection.  Positive  results are indicative of active infection with SARS-CoV-2.  Clinical  correlation with patient history and other diagnostic information is  necessary to determine patient infection status.  Positive results do  not rule out bacterial infection or co-infection with other viruses. If result is PRESUMPTIVE POSTIVE SARS-CoV-2 nucleic acids MAY BE PRESENT.   A presumptive positive result was obtained on the submitted specimen  and confirmed on repeat testing.  While 2019 novel coronavirus  (SARS-CoV-2) nucleic acids may be present in the submitted sample  additional confirmatory testing may be necessary for epidemiological  and / or clinical management purposes  to differentiate between  SARS-CoV-2 and other Sarbecovirus currently known to infect humans.  If clinically indicated additional testing with an alternate test  methodology [phone]([id number]) is advised. The SARS-CoV-2 RNA is generally  detectable in upper and lower respiratory sp ecimens during the acute  phase of infection. The expected result is Negative. Fact Sheet for Patients:   BoilerBrush.com.cyhttps://www.fda.gov/media/136312/download Fact Sheet for Healthcare Providers: https://pope.com/https://www.fda.gov/media/136313/download This test is not yet approved or cleared by the [place] FDA and has been authorized for detection and/or diagnosis of SARS-CoV-2 by FDA under an Emergency Use Authorization (EUA).  This EUA will remain in effect (meaning this test can be used) for the duration of the COVID-19 declaration under Section 564(b)(1) of the Act, 21 U.S.C. section 360bbb-3(b)(1), unless the authorization is terminated or revoked sooner. Performed at [facility], [address] [place], [place] [address]     Radiology Reports Ct Abdomen Pelvis Wo Contrast  Result Date: 08/21/2018 CLINICAL DATA:  Abdominal pain and fever. Nausea and vomiting. Right inguinal region swelling. EXAM: CT ABDOMEN AND PELVIS WITHOUT CONTRAST TECHNIQUE: Multidetector CT imaging of the abdomen and pelvis was performed following the standard protocol without oral or IV contrast. COMPARISON:  None. FINDINGS: Lower chest: There is atelectatic change in the lung bases. No lung base edema or consolidation is evident. Hepatobiliary: No focal liver lesions are appreciable on this noncontrast enhanced study. The gallbladder wall is not appreciably thickened. There is no biliary duct dilatation. Pancreas: There is no evident pancreatic mass or inflammatory focus. Spleen: No splenic lesions are evident. Adrenals/Urinary Tract: Adrenals bilaterally appear unremarkable. Kidneys bilaterally show no evident mass or hydronephrosis on either side. There is no evident renal or ureteral calculus on either side. Urinary bladder is midline with wall thickness within normal limits. Stomach/Bowel: There is no appreciable bowel wall or mesenteric thickening. Terminal ileum appears normal. There is no bowel obstruction. There is no evident free air or portal venous air. Vascular/Lymphatic: There is no abdominal aortic aneurysm. No  vascular lesions are evident. Reproductive: There are prominent inguinal lymph nodes in the medial lower pelvis and upper thigh region with surrounding soft tissue stranding. The largest lymph node in this area measures 2.4  x 1.5 cm. There is subcutaneous thickening of the lower pelvic/upper thigh region wall in the anterior right lower inguinal region. There is no frank fluid collection or abscess. No abnormal air in this area. No bowel containing hernia in this area. Elsewhere, there are several borderline prominent inguinal lymph nodes which have central fatty hila, suggesting benign etiology. There are a few subcentimeter common femoral artery lymph nodes which do not meet size criteria for pathologic significance. No adenopathy based on size criteria noted within the peritoneum or retroperitoneum of the abdomen and pelvis. Other: Appendix appears normal. No abscess or ascites evident in the abdomen or pelvis. Musculoskeletal: No blastic or lytic bone lesions. No intramuscular lesions are evident. IMPRESSION: 1. Localized adenopathy and soft tissue stranding with subcutaneous thickening in the medial right lower inguinal and proximal thigh region extending to the upper scrotal sac. No associated fluid or frank abscess. No air in this area. There is inflammatory change which may be due to a combination of adenitis and panniculitis. No inflammatory change of this nature seen elsewhere on this study 2. No evident bowel obstruction. No abscess in the abdomen or pelvis. Appendix appears normal. No evident hernia. 3. No evident renal or ureteral calculus. No hydronephrosis. Urinary bladder wall thickness is normal. Electronically Signed   By: Bretta BangWilliam  Woodruff III M.D.   On: 08/21/2018 14:37   Ct Pelvis Wo Contrast  Result Date: 08/25/2018 CLINICAL DATA:  Right inguinal cellulitis/inflammation EXAM: CT PELVIS WITHOUT CONTRAST TECHNIQUE: Multidetector CT imaging of the pelvis was performed following the standard  protocol without intravenous contrast. COMPARISON:  August 21, 2018 FINDINGS: Urinary Tract: The urinary bladder wall thickness is normal. No ureteral abnormality in the regions which are visualized. Bowel:  No evident bowel obstruction.  No bowel wall thickening. Vascular/Lymphatic: There are scattered foci arterial vascular calcification. No distal abdominal aortic or pelvic arterial aneurysm. Reproductive: Prostate and seminal vesicles appear normal in size and contour. There is vas deferens calcification bilaterally. There is fluid density attenuation throughout the scrotal regions tracking into the upper perineum on each side, likely sizable hydroceles. Other: There is increased soft tissue stranding in the right inferior inguinal region tracking toward the scrotum. There is more ill-defined fluid in this area which is increased in size compared to the previous study, measuring 7.6 x 3.4 cm. Soft tissue stranding extends to the level of the upper scrotum, although fluid does not extend to this distance. There are nearby mildly prominent lymph nodes which likely are of reactive/inflammatory etiology. No similar changes are noted elsewhere in the pelvis. Visualized portions of the appendix appear normal. Within the peritoneum and retroperitoneum of the pelvis, there is no abscess or ascites. Musculoskeletal: No blastic or lytic bone lesions evident. No fracture or dislocation. No intramuscular lesions are demonstrable. IMPRESSION: 1. Apparent phlegmon tracking in the medial inguinal region with surrounding soft tissue stranding and cellulitis. Fluid in this area has increased in volume and become more irregular in contour. There is no frank air in this collection. Suspect developing phlegmon in this area. Fluid tracks to the anterior most aspect of the perineum. Nearby lymph nodes are probably of inflammatory etiology. 2. Sizable apparent scrotal hydroceles bilaterally with fluid attenuation in these apparent  hydroceles. Fluid tracks into the inferior perineum on both sides. 3. There is no ascites or abscess within the peritoneum or retroperitoneum of the pelvis. Visualized appendix appears normal. No bony destruction or erosion. These results will be called to the ordering clinician or representative by  the Radiologist Assistant, and communication documented in the PACS or zVision Dashboard. Electronically Signed   By: Bretta BangWilliam  Woodruff III M.D.   On: 08/25/2018 10:36   Ct Tibia Fibula Right Wo Contrast  Result Date: 08/23/2018 CLINICAL DATA:  Right shin pain and discoloration in a diabetic patient with severe hyperglycemia. Patient admitted to the hospital for right groin cellulitis 08/21/2018. EXAM: CT OF THE LOWER RIGHT EXTREMITY WITHOUT CONTRAST TECHNIQUE: Multidetector CT imaging of the right lower extremity was performed according to the standard protocol. COMPARISON:  None. FINDINGS: Bones/Joint/Cartilage No fracture. No periosteal reaction or bony destructive change. Small bone island in the lateral epicondyle of the distal femur incidentally noted. Osteochondral lesion of the medial talar dome measures 0.6 cm transverse by 1.1 cm AP. Ligaments Suboptimally assessed by CT. Muscles and Tendons Intact. No intramuscular fluid collection or gas within muscle or tracking along fascial planes is identified. No fluid collection or evidence of muscular edema. Soft tissues There is some subcutaneous edema about the lower leg. No soft tissue gas or focal fluid collection. No radiopaque foreign body. IMPRESSION: Subcutaneous edema about the lower leg could be due to cellulitis or dependent change. Negative for abscess. No evidence of osteomyelitis, myositis or fasciitis. Osteochondral lesion medial talar dome. Electronically Signed   By: Drusilla Kannerhomas  Dalessio M.D.   On: 08/23/2018 15:43   [place] Venous Img Lower Unilateral Right  Result Date: 08/21/2018 CLINICAL DATA:  Right lower extremity pain and edema for the past 2  weeks. History of smoking. Evaluate for DVT. EXAM: RIGHT LOWER EXTREMITY VENOUS DOPPLER ULTRASOUND TECHNIQUE: Gray-scale sonography with graded compression, as well as color Doppler and duplex ultrasound were performed to evaluate the lower extremity deep venous systems from the level of the common femoral vein and including the common femoral, femoral, profunda femoral, popliteal and calf veins including the posterior tibial, peroneal and gastrocnemius veins when visible. The superficial great saphenous vein was also interrogated. Spectral Doppler was utilized to evaluate flow at rest and with distal augmentation maneuvers in the common femoral, femoral and popliteal veins. COMPARISON:  None. FINDINGS: Contralateral Common Femoral Vein: Respiratory phasicity is normal and symmetric with the symptomatic side. No evidence of thrombus. Normal compressibility. Common Femoral Vein: No evidence of thrombus. Normal compressibility, respiratory phasicity and response to augmentation. Saphenofemoral Junction: No evidence of thrombus. Normal compressibility and flow on color Doppler imaging. Profunda Femoral Vein: No evidence of thrombus. Normal compressibility and flow on color Doppler imaging. Femoral Vein: No evidence of thrombus. Normal compressibility, respiratory phasicity and response to augmentation. Popliteal Vein: No evidence of thrombus. Normal compressibility, respiratory phasicity and response to augmentation. Calf Veins: No evidence of thrombus. Normal compressibility and flow on color Doppler imaging. Superficial Great Saphenous Vein: No evidence of thrombus. Normal compressibility. Venous Reflux:  None. Other Findings:  None. IMPRESSION: No evidence of DVT within the right lower extremity. Electronically Signed   By: Simonne ComeJohn  Watts M.D.   On: 08/21/2018 14:00     CBC Recent Labs  Lab 08/21/18 1253 08/22/18 0316 08/23/18 0242 08/25/18 0634  WBC 10.9* 10.2 9.6 7.4  HGB 14.5 13.3 13.7 13.2  HCT 42.8  40.1 41.8 40.1  PLT 203 185 189 217  MCV 81.5 82.5 84.8 82.9  MCH 27.6 27.4 27.8 27.3  MCHC 33.9 33.2 32.8 32.9  RDW 14.1 14.0 14.1 13.8  LYMPHSABS 1.2  --   --   --   MONOABS 1.1*  --   --   --   EOSABS 0.3  --   --   --  BASOSABS 0.0  --   --   --     Chemistries  Recent Labs  Lab 08/21/18 1253 08/22/18 0316 08/23/18 0242 08/25/18 0634  NA 129* 137 139 136  K 4.1 4.0 4.2 4.2  CL 93* 106 106 103  CO2 GLUCOSE 661* 272* 256* 305*  BUN 24* 22* 17 13  CREATININE 2.04* 1.79* 1.64* 1.64*  CALCIUM 7.8* 7.7* 8.0* 8.6*  AST 19  --   --   --   ALT 22  --   --   --   ALKPHOS 71  --   --   --   BILITOT 0.6  --   --   --    ------------------------------------------------------------------------------------------------------------------ estimated creatinine clearance is 83.4 mL/min (A) (by C-G formula based on SCr of 1.64 mg/dL (H)). ------------------------------------------------------------------------------------------------------------------ No results for input(s): HGBA1C in the last 72 hours. ------------------------------------------------------------------------------------------------------------------ No results for input(s): CHOL, HDL, LDLCALC, TRIG, CHOLHDL, LDLDIRECT in the last 72 hours. ------------------------------------------------------------------------------------------------------------------ No results for input(s): TSH, T4TOTAL, T3FREE, THYROIDAB in the last 72 hours.  Invalid input(s): FREET3 ------------------------------------------------------------------------------------------------------------------ No results for input(s): VITAMINB12, FOLATE, FERRITIN, TIBC, IRON, RETICCTPCT in the last 72 hours.  Coagulation profile No results for input(s): INR, PROTIME in the last 168 hours.  No results for input(s): DDIMER in the last 72 hours.  Cardiac Enzymes No results for input(s): CKMB, TROPONINI, MYOGLOBIN in the last 168 hours.  Invalid  input(s): CK ------------------------------------------------------------------------------------------------------------------ Invalid input(s): POCBNP    Assessment & Plan   Right groin cellulitis- seems to be worse today with some new fluctuance noted on exam.  No signs of sepsis. -Continue IV vancomycin and zosyn -Discussed with Dr. Mena Goes (urology), who recommended repeating CT pelvis for further evaluation -Plan for likely I&D today per urology -Patient is NPO  Severe hyperglycemia in type 2 diabetes- blood sugars remain elevated -Increase Lantus to 50 units daily -Resistant SSI  Right shin pain- likely related to his right groin cellulitis.  -CT of right tib/fib with subcutaneous edema in the lower leg but can be cellulitis or dependent change -Continue IV abx as above  CKD stage III- initially with AKI, but this has resolved. -Continue lisinopril -Recheck creatinine in the morning  Hypertension- BPs improved -Continue lisinopril  Morbid obesity- weight loss recommended  DVT prophylaxis- lovenox  All the records are reviewed and case discussed with ED provider. Management plans discussed with the patient, family and they are in agreement.  CODE STATUS: Full code      Code Status Orders  (From admission, onward)         Start     Ordered   08/21/18 1632  Full code  Continuous     08/21/18 1631        Code Status History    Date Active Date Inactive Code Status Order ID Comments User Context   08/31/2015 2323 09/01/2015 2035 Full Code [id number]  Arnaldo Natal, MD Inpatient      Consults urology  DVT Prophylaxis  Lovenox  Lab Results  Component Value Date   PLT 217 08/25/2018     Time Spent in minutes 50 minutes  Greater than 50% of time spent in care coordination and counseling patient regarding the condition and plan of care.   Jinny Blossom Mayo M.D on 08/25/2018 at 2:06 PM  Between 7am to 6pm - Pager [phone]  After 6pm go to  [email or web address] - Social research officer, government  [employer]   Office  [phone]

## 2018-08-25 NOTE — Discharge Instructions (Signed)
How to Change Your Dressing A dressing is a material that is placed in and over wounds. A dressing helps your wound to heal by protecting it from:  Bacteria.  Worse injury.  Being too dry or too wet.  Keeps the wound open  Closing over and collecting more pus What are the risks? The sticky (adhesive) tape that is used with a dressing may make your skin sore or irritated, or it may cause a rash. These are the most common problems. However, more serious problems can develop, such as:  Bleeding.  Infection. How to change your dressing Getting Ready to Change Your Dressing   Take a shower before you do the first dressing change of the day. If your doctor does not want your wound to get wet and your dressing is not waterproof, you may need to put plastic leak-proof sealing wrap on your dressing to protect it.  If needed, take pain medicine as told by your doctor 30 minutes before you change your dressing.  Set up a clean station for wound care. You will need: ? A plastic trash bag that is open and ready to use. ? Hand sanitizer. ? Wound cleanser or salt-water solution (saline) as told by your doctor. ? New dressing material or bandages. Make sure to open the dressing package so the dressing stays on the inside of the package. You may also need these supplies in your clean station:  A box of vinyl gloves.  Tape.  Skin protectant. This may be a wipe, film, or spray.  Clean or germ-free (sterile) scissors.  A cotton-tipped applicator.  Taking Off Your Old Dressing  Wash your hands with soap and water. Dry your hands with a clean towel. If you cannot use soap and water, use hand sanitizer.  If you are using gloves, put on the gloves before you take off the dressing.  Gently take off any adhesive or tape by pulling it off in the direction of your hair growth. Only touch the outside edges of the dressing.  Take off the dressing. If the dressing sticks to your skin, wet the  dressing with a germ-free salt-water solution. This helps it come off more easily.  Take out the gauze or packing in your wound.  Throw the old dressing supplies into the ready trash bag.  Take off your gloves. To take off each glove, grab the cuff with your other hand and turn the glove inside out. Put the gloves in the trash right away.  Wash your hands with soap and water. Dry your hands with a clean towel. If you cannot use soap and water, use hand sanitizer. Cleaning Your Wound  Follow instructions from your doctor about how to clean your wound. This may include using a salt-water solution or recommended wound cleanser.  Do not use over-the-counter medicated or antiseptic creams, sprays, liquids, or dressings unless your doctor tells you to do that.  Use a clean gauze pad to clean the area fully with the salt-water solution or wound cleanser that your doctor recommends.  Throw the gauze pad into the trash bag.  Wash your hands with soap and water. Dry your hands with a clean towel. If you cannot use soap and water, use hand sanitizer. Putting on the Dressing  If your doctor recommended a skin protectant, put it on the skin around the wound.  Wet some gauze with saline and squeeze it to remove excess water  Slip some gauze into the incision and cavity  Cover the wound with the recommended dressing, such as a nonstick gauze or bandage. Make sure to touch only the outside edges of the dressing. Do not touch the inside of the dressing.  Attach the dressing so all sides stay in place. You may do this with the attached medical adhesive, roll gauze, or tape. If you use tape, do not wrap the tape all the way around your arm or leg.  Take off your gloves. Put them in the trash bag with the old dressing. Tie the bag shut and throw it away.  Wash your hands with soap and water. Dry your hands with a clean towel. If you cannot use soap and water, use hand sanitizer. Get help if:   You  have new pain.  You have irritation, a rash, or itching around the wound or dressing.  Changing your dressing is painful.  Changing your dressing causes a lot of bleeding. Get help right away if:  You have very bad pain.  You have signs of infection, such as: ? More redness, swelling, or pain. ? More fluid or blood. ? Warmth. ? Pus or a bad smell. ? Red streaks leading from wound. ? A fever. This information is not intended to replace advice given to you by your health care provider. Make sure you discuss any questions you have with your health care provider. Document Released: 06/02/2008 Document Revised: 08/12/2015 Document Reviewed: 12/10/2014 Elsevier Interactive Patient Education  2019 [employer]

## 2018-08-25 NOTE — Anesthesia Postprocedure Evaluation (Signed)
Anesthesia Post Note  Patient: Micheal Robinson.  Procedure(s) Performed: INCISION AND DRAINAGE ABSCESS (Right )  Patient location during evaluation: SICU Anesthesia Type: General Level of consciousness: awake Pain management: pain level controlled Vital Signs Assessment: post-procedure vital signs reviewed and stable Respiratory status: spontaneous breathing (BiPAP) Cardiovascular status: stable Postop Assessment: no apparent nausea or vomiting Anesthetic complications: no     Last Vitals:  Vitals:   08/25/18 1926 08/25/18 1945  BP: (!) 173/75 (!) 149/96  Pulse: (!) 121 (!) 119  Resp: (!) 29 (!) 22  Temp: 37.1 C   SpO2: 99% 98%    Last Pain:  Vitals:   08/25/18 1552  TempSrc: Oral  PainSc:                  Micheal Robinson

## 2018-08-25 NOTE — Progress Notes (Signed)
Pt notified this nurse he is leaving AMA, MD Mayo paged, waiting on return call, also notified (secure chat).

## 2018-08-25 NOTE — Progress Notes (Signed)
Pts BG [phone], Pt is NPO. MD Mayo notified. Per MD hold Lantus and hold SSI, pt going for procedure this afternoon.

## 2018-08-25 NOTE — Anesthesia Procedure Notes (Signed)
Procedure Name: Intubation Date/Time: 08/25/2018 5:30 PM Performed by: Chanetta Marshall, CRNA Pre-anesthesia Checklist: Patient identified, Emergency Drugs available and Suction available Patient Re-evaluated:Patient Re-evaluated prior to induction Oxygen Delivery Method: Circle system utilized Preoxygenation: Pre-oxygenation with 100% oxygen Induction Type: IV induction Laryngoscope Size: McGraph and 3 Grade View: Grade II Tube type: Oral Number of attempts: 1 Airway Equipment and Method: Stylet and Oral airway Placement Confirmation: ETT inserted through vocal cords under direct vision,  positive ETCO2,  breath sounds checked- equal and bilateral and CO2 detector Secured at: 23 cm Tube secured with: Tape Dental Injury: Teeth and Oropharynx as per pre-operative assessment  Difficulty Due To: Difficulty was anticipated

## 2018-08-25 NOTE — Anesthesia Post-op Follow-up Note (Signed)
Anesthesia QCDR form completed.        

## 2018-08-25 NOTE — Progress Notes (Addendum)
Subjective: Patient reports increased swelling of the right inguinal region and increased swelling of the penis and scrotum.  Repeat CT of the pelvis reveals a more formed fluid collection is now tracking up toward the skin.  Dr. Brett Robinson also mention she may have felt some fluctuance and I agree.  Objective: Vital signs in last 24 hours: Temp:  [97.8 F (36.6 C)-98.4 F (36.9 C)] 98.4 F (36.9 C) (06/07 0801) Pulse Rate:  [95-99] 96 (06/07 0801) Resp:  [16-18] 18 (06/07 0034) BP: (138-147)/(88-89) 147/88 (06/07 0034) SpO2:  [91 %-98 %] 98 % (06/07 0801) Weight:  [137.3 kg] 137.3 kg (06/07 0034)  Intake/Output from previous day: 06/06 0701 - 06/07 0700 In: 945 [P.O.:720; IV Piggyback:225] Out: -  Intake/Output this shift: No intake/output data recorded.  Physical Exam:  He looks well, no acute distress On exam there is simple penoscrotal edema but up in the right inguinal area there is induration of about 7 cm and at one end of that it does feel a bit fluctuant and soft.  There is no necrosis of the skin either of the suprapubic area, penis or scrotum.  Perineum is palpably normal.  I think he was just seeing edema on the CT down in the scrotum and perineum. The induration is tender. Penis and scrotum non-tender.   Lab Results: Recent Labs    08/23/18 0242 08/25/18 0634  HGB 13.7 13.2  HCT 41.8 40.1   BMET Recent Labs    08/23/18 0242 08/25/18 0634  NA 139 136  K 4.2 4.2  CL 106 103  CO2 26 26  GLUCOSE 256* 305*  BUN 17 13  CREATININE 1.64* 1.64*  CALCIUM 8.0* 8.6*   No results for input(s): LABPT, INR in the last 72 hours. No results for input(s): LABURIN in the last 72 hours. Results for orders placed or performed during the hospital encounter of 08/21/18  SARS Coronavirus 2 (CEPHEID - Performed in [place] hospital lab), Hosp Order     Status: None   Collection Time: 08/21/18  3:54 PM  Result Value Ref Range Status   SARS Coronavirus 2 NEGATIVE NEGATIVE Final     Comment: (NOTE) If result is NEGATIVE SARS-CoV-2 target nucleic acids are NOT DETECTED. The SARS-CoV-2 RNA is generally detectable in upper and lower  respiratory specimens during the acute phase of infection. The lowest  concentration of SARS-CoV-2 viral copies this assay can detect is 250  copies / mL. A negative result does not preclude SARS-CoV-2 infection  and should not be used as the sole basis for treatment or other  patient management decisions.  A negative result may occur with  improper specimen collection / handling, submission of specimen other  than nasopharyngeal swab, presence of viral mutation(s) within the  areas targeted by this assay, and inadequate number of viral copies  (<250 copies / mL). A negative result must be combined with clinical  observations, patient history, and epidemiological information. If result is POSITIVE SARS-CoV-2 target nucleic acids are DETECTED. The SARS-CoV-2 RNA is generally detectable in upper and lower  respiratory specimens dur ing the acute phase of infection.  Positive  results are indicative of active infection with SARS-CoV-2.  Clinical  correlation with patient history and other diagnostic information is  necessary to determine patient infection status.  Positive results do  not rule out bacterial infection or co-infection with other viruses. If result is PRESUMPTIVE POSTIVE SARS-CoV-2 nucleic acids MAY BE PRESENT.   A presumptive positive result was obtained  on the submitted specimen  and confirmed on repeat testing.  While 2019 novel coronavirus  (SARS-CoV-2) nucleic acids may be present in the submitted sample  additional confirmatory testing may be necessary for epidemiological  and / or clinical management purposes  to differentiate between  SARS-CoV-2 and other Sarbecovirus currently known to infect humans.  If clinically indicated additional testing with an alternate test  methodology [phone]([id number]) is advised. The  SARS-CoV-2 RNA is generally  detectable in upper and lower respiratory sp ecimens during the acute  phase of infection. The expected result is Negative. Fact Sheet for Patients:  BoilerBrush.com.cyhttps://www.fda.gov/media/136312/download Fact Sheet for Healthcare Providers: https://pope.com/https://www.fda.gov/media/136313/download This test is not yet approved or cleared by the [place] FDA and has been authorized for detection and/or diagnosis of SARS-CoV-2 by FDA under an Emergency Use Authorization (EUA).  This EUA will remain in effect (meaning this test can be used) for the duration of the COVID-19 declaration under Section 564(b)(1) of the Act, 21 U.S.C. section 360bbb-3(b)(1), unless the authorization is terminated or revoked sooner. Performed at [facility], [address] [place], [place] [address]     Studies/Results: Ct Pelvis Wo Contrast  Result Date: 08/25/2018 CLINICAL DATA:  Right inguinal cellulitis/inflammation EXAM: CT PELVIS WITHOUT CONTRAST TECHNIQUE: Multidetector CT imaging of the pelvis was performed following the standard protocol without intravenous contrast. COMPARISON:  August 21, 2018 FINDINGS: Urinary Tract: The urinary bladder wall thickness is normal. No ureteral abnormality in the regions which are visualized. Bowel:  No evident bowel obstruction.  No bowel wall thickening. Vascular/Lymphatic: There are scattered foci arterial vascular calcification. No distal abdominal aortic or pelvic arterial aneurysm. Reproductive: Prostate and seminal vesicles appear normal in size and contour. There is vas deferens calcification bilaterally. There is fluid density attenuation throughout the scrotal regions tracking into the upper perineum on each side, likely sizable hydroceles. Other: There is increased soft tissue stranding in the right inferior inguinal region tracking toward the scrotum. There is more ill-defined fluid in this area which is increased in size compared to the previous  study, measuring 7.6 x 3.4 cm. Soft tissue stranding extends to the level of the upper scrotum, although fluid does not extend to this distance. There are nearby mildly prominent lymph nodes which likely are of reactive/inflammatory etiology. No similar changes are noted elsewhere in the pelvis. Visualized portions of the appendix appear normal. Within the peritoneum and retroperitoneum of the pelvis, there is no abscess or ascites. Musculoskeletal: No blastic or lytic bone lesions evident. No fracture or dislocation. No intramuscular lesions are demonstrable. IMPRESSION: 1. Apparent phlegmon tracking in the medial inguinal region with surrounding soft tissue stranding and cellulitis. Fluid in this area has increased in volume and become more irregular in contour. There is no frank air in this collection. Suspect developing phlegmon in this area. Fluid tracks to the anterior most aspect of the perineum. Nearby lymph nodes are probably of inflammatory etiology. 2. Sizable apparent scrotal hydroceles bilaterally with fluid attenuation in these apparent hydroceles. Fluid tracks into the inferior perineum on both sides. 3. There is no ascites or abscess within the peritoneum or retroperitoneum of the pelvis. Visualized appendix appears normal. No bony destruction or erosion. These results will be called to the ordering clinician or representative by the Radiologist Assistant, and communication documented in the PACS or zVision Dashboard. Electronically Signed   By: Bretta BangWilliam  Woodruff III M.D.   On: 08/25/2018 10:36   Ct Tibia Fibula Right Wo Contrast  Result Date: 08/23/2018 CLINICAL  DATA:  Right shin pain and discoloration in a diabetic patient with severe hyperglycemia. Patient admitted to the hospital for right groin cellulitis 08/21/2018. EXAM: CT OF THE LOWER RIGHT EXTREMITY WITHOUT CONTRAST TECHNIQUE: Multidetector CT imaging of the right lower extremity was performed according to the standard protocol.  COMPARISON:  None. FINDINGS: Bones/Joint/Cartilage No fracture. No periosteal reaction or bony destructive change. Small bone island in the lateral epicondyle of the distal femur incidentally noted. Osteochondral lesion of the medial talar dome measures 0.6 cm transverse by 1.1 cm AP. Ligaments Suboptimally assessed by CT. Muscles and Tendons Intact. No intramuscular fluid collection or gas within muscle or tracking along fascial planes is identified. No fluid collection or evidence of muscular edema. Soft tissues There is some subcutaneous edema about the lower leg. No soft tissue gas or focal fluid collection. No radiopaque foreign body. IMPRESSION: Subcutaneous edema about the lower leg could be due to cellulitis or dependent change. Negative for abscess. No evidence of osteomyelitis, myositis or fasciitis. Osteochondral lesion medial talar dome. Electronically Signed   By: Drusilla Kannerhomas  Dalessio M.D.   On: 08/23/2018 15:43   I reviewed CT from today and 6/3. I spoke with Dr. Margarita GrizzleWoodruff in Rad.   Assessment/Plan:  Right inguinal abscess- a little larger today and tracking toward the skin.  I discussed the CT scan findings and the physical exam findings with the patient and we went over the nature risk benefits and alternatives to incision and drainage.  The patient ate breakfast late and also needs a urine drug screen if we were to go to the OR.  I asked him about bedside I&D which I think would be reasonable to attempt and he elected to proceed. His wife had to pack a wound so he described the I&D, packing process to more accurately.   Addendum: Nurse with US was putting in IV and I used it at bedside. The induration up near the SP area and lateral doesn't show fluid but theres more fluid inferiorly and laterally in the inguinal crease just above the scrotum. This inferior lateral area feels more fluctuant but I cant get a good exam with him in the bed and his anatomy. Also it is very painful for him. I think it  best to get him in lithotomy and do an exam under anesthesia and I&D. Discussed with patient and he elects to proceed.    LOS: 4 days   Micheal Robinson 08/25/2018, 2:18 PM

## 2018-08-25 NOTE — Progress Notes (Signed)
[place] Progress Note Patient Name: Micheal Robinson. DOB: [birth year] MRN: [id number]   Date of Service  08/25/2018  HPI/Events of Note  44 y/o male with  a Deep right inguinal abscess with thickened indurated skin surrounding s/p I & D. Developed hypoxic respiratory failure post-op requiring BiPAP. PCCM asked to assume care. VSSS.  eICU Interventions  No new orders.      Intervention Category Evaluation Type: New Patient Evaluation  Lysle Dingwall 08/25/2018, 10:05 PM

## 2018-08-25 NOTE — Anesthesia Preprocedure Evaluation (Addendum)
Anesthesia Evaluation  Patient identified by MRN, date of birth, ID band Patient awake    Reviewed: Allergy & Precautions, H&P , NPO status , Patient's Chart, lab work & pertinent test results  History of Anesthesia Complications Negative for: history of anesthetic complications  Airway Mallampati: III  TM Distance: >3 FB Neck ROM: full    Dental  (+) Chipped, Poor Dentition   Pulmonary neg shortness of breath, former smoker,           Cardiovascular Exercise Tolerance: Good hypertension, (-) angina(-) Past MI and (-) DOE      Neuro/Psych CVA (Wife reports short term memory loss), Residual Symptoms negative psych ROS   GI/Hepatic negative GI ROS, Neg liver ROS, neg GERD  ,  Endo/Other  diabetes, Type 2, Insulin Dependent  Renal/GU CRFRenal disease     Musculoskeletal   Abdominal   Peds  Hematology negative hematology ROS (+)   Anesthesia Other Findings Past Medical History: No date: Atypical chest pain     Comment:  a. 08/2015, neg troponin;  b. 08/2015 Echo: EF 50-55%, no               rwma, nl RV fxn. No date: Diabetes mellitus without complication ([place])     Comment:  a. 08/2015: uncontrolled, A1c 13.4 No date: Essential hypertension No date: Hyperlipidemia No date: Morbid obesity ([place]) No date: Stroke [facility])  Past Surgical History: No date: HAND SURGERY  BMI    Body Mass Index:  41.07 kg/m      Reproductive/Obstetrics negative OB ROS                            Anesthesia Physical Anesthesia Plan  ASA: III  Anesthesia Plan: General LMA   Post-op Pain Management:    Induction: Intravenous  PONV Risk Score and Plan: Dexamethasone, Ondansetron, Midazolam and Treatment may vary due to age or medical condition  Airway Management Planned: LMA  Additional Equipment:   Intra-op Plan:   Post-operative Plan: Extubation in OR  Informed Consent: I have reviewed the patients  History and Physical, chart, labs and discussed the procedure including the risks, benefits and alternatives for the proposed anesthesia with the patient or authorized representative who has indicated his/her understanding and acceptance.     Dental Advisory Given  Plan Discussed with: Anesthesiologist, CRNA and Surgeon  Anesthesia Plan Comments: (Patient consented for risks of anesthesia including but not limited to:  - adverse reactions to medications - damage to teeth, lips or other oral mucosa - sore throat or hoarseness - Damage to heart, brain, lungs or loss of life  Patient voiced understanding.)        Anesthesia Quick Evaluation

## 2018-08-25 NOTE — Consult Note (Signed)
PULMONARY / CRITICAL CARE MEDICINE  Name: Micheal FerraraLeonard R Nikolic Jr. MRN: [id number] DOB: [birth year]    LOS: 4  Referring Provider:  Dr. Mena GoesEskridge Reason for Referral:  Deep right inguinal abscess with thickened indurated skin surrounding s/p I & D Brief patient description: 43 year old African-American male presenting with a deep right inguinal abscess, now status post I&D; required BiPAP postprocedure.  HPI: This is a 43 year old African-American male with a medical history as indicated below who was admitted on 08/21/2018 with a right groin abscess and leg swelling.  At that time he was also found to have severe hyperglycemia.  CT showed right groin cellulitis and she was started on broad-spectrum antibiotics.  Repeat CT showed a more formed fluid collection in the right groin with tracking.  He was taken to the OR today for an I&D.  Postprocedure he was transferred to the ICU for close monitoring and for BiPAP until he became fully awake   Past Medical History:  Diagnosis Date  . Atypical chest pain    a. 08/2015, neg troponin;  b. 08/2015 Echo: EF 50-55%, no rwma, nl RV fxn.  . Diabetes mellitus without complication (HCC)    a. 08/2015: uncontrolled, A1c 13.4  . Difficult intubation   . Essential hypertension   . Hyperlipidemia   . Morbid obesity (HCC)   . Stroke [facility](HCC)    Past Surgical History:  Procedure Laterality Date  . HAND SURGERY     No current facility-administered medications on file prior to encounter.    Current Outpatient Medications on File Prior to Encounter  Medication Sig  . atorvastatin (LIPITOR) 80 MG tablet Take 80 mg by mouth daily at 6 PM.   . insulin aspart (NOVOLOG) 100 UNIT/ML FlexPen Inject 20-22 Units into the skin 3 (three) times daily with meals. [facility] on 08/19/18 Novolog 22 units with breakfast and lunch, Novolog 20 units with supper  . Insulin Detemir (LEVEMIR) 100 UNIT/ML Pen Inject 44 Units into the skin at bedtime.  [facility] 08/19/18  . lisinopril (PRINIVIL,ZESTRIL) 10 MG tablet Take 10 mg by mouth.  . lovastatin (MEVACOR) 40 MG tablet Take 1 tablet (40 mg total) by mouth at bedtime.  . metFORMIN (GLUCOPHAGE) 1000 MG tablet Take 1,000 mg by mouth 2 (two) times a day.   Marland Kitchen. ammonium lactate (LAC-HYDRIN) 12 % cream Apply topically as needed for dry skin. (Patient not taking: Reported on 08/21/2018)    Allergies Allergies  Allergen Reactions  . Cheese Hives and Swelling    Family History Family History  Problem Relation Age of Onset  . Diabetes Mellitus II Maternal Grandmother   . Diabetes Mellitus II Mother   . Hypertension Mother   . Diabetes Mellitus II Father   . Hypertension Father    Social History  reports that he quit smoking about 2 years ago. His smoking use included cigars. He has never used smokeless tobacco. He reports that he does not drink alcohol or use drugs.  Review Of Systems: All systems reviewed.  Pertinent positives include right inguinal pain, bladder spasms and somnolence.  All other systems are negative  VITAL SIGNS: BP (!) 149/96   Pulse (!) 119   Temp (!) 100.4 F (38 C) (Oral)   Resp (!) 22   Ht 6' (1.829 m)   Wt (!) 137.3 kg   SpO2 98%   BMI 41.07 kg/m   HEMODYNAMICS:    VENTILATOR SETTINGS:  INTAKE / OUTPUT: I/O last 3 completed shifts: In: 1645 [P.O.:720; I.V.:700; IV Piggyback:225] Out: 5 [Blood:5]  PHYSICAL EXAMINATION: General: Obese, in mild respiratory distress HEENT: PERRLA, trachea midline, neck is short Neuro: Somnolent, awakens to voice and touch, moves all extremities Cardiovascular: Apical pulse regular, S1-S2, no murmur regurg or gallop, +2 pulses bilateral, no edema Lungs: Mild increase in work of breathing, bilateral breath sounds, no wheezes or rhonchi Abdomen: Obese, positive bowel sounds in all 4 quadrants, palpation reveals no organomegaly GU: Scrotum swollen, right inguinal area with open I&D  incision with dressing intact, serosanguineous drainage Musculoskeletal: No joint deformities Skin: Warm and dry  LABS:  BMET Recent Labs  Lab 08/22/18 0316 08/23/18 0242 08/25/18 0634  NA 137 139 136  K 4.0 4.2 4.2  CL 106 106 103  CO2 24 26 26   BUN 22* 17 13  CREATININE 1.79* 1.64* 1.64*  GLUCOSE 272* 256* 305*    Electrolytes Recent Labs  Lab 08/22/18 0316 08/23/18 0242 08/25/18 0634  CALCIUM 7.7* 8.0* 8.6*    CBC Recent Labs  Lab 08/22/18 0316 08/23/18 0242 08/25/18 0634  WBC 10.2 9.6 7.4  HGB 13.3 13.7 13.2  HCT 40.1 41.8 40.1  PLT 185 189 217    Coag's No results for input(s): APTT, INR in the last 168 hours.  Sepsis Markers Recent Labs  Lab 08/21/18 2219 08/25/18 1016  LATICACIDVEN 2.4* 1.1    ABG No results for input(s): PHART, PCO2ART, PO2ART in the last 168 hours.  Liver Enzymes Recent Labs  Lab 08/21/18 1253  AST 19  ALT 22  ALKPHOS 71  BILITOT 0.6  ALBUMIN 2.6*    Cardiac Enzymes No results for input(s): TROPONINI, PROBNP in the last 168 hours.  Glucose Recent Labs  Lab 08/24/18 2130 08/25/18 0801 08/25/18 1039 08/25/18 1147 08/25/18 1648 08/25/18 1858  GLUCAP 379* 261* 323* 270* 232* 279*    Imaging Ct Pelvis Wo Contrast  Result Date: 08/25/2018 CLINICAL DATA:  Right inguinal cellulitis/inflammation EXAM: CT PELVIS WITHOUT CONTRAST TECHNIQUE: Multidetector CT imaging of the pelvis was performed following the standard protocol without intravenous contrast. COMPARISON:  August 21, 2018 FINDINGS: Urinary Tract: The urinary bladder wall thickness is normal. No ureteral abnormality in the regions which are visualized. Bowel:  No evident bowel obstruction.  No bowel wall thickening. Vascular/Lymphatic: There are scattered foci arterial vascular calcification. No distal abdominal aortic or pelvic arterial aneurysm. Reproductive: Prostate and seminal vesicles appear normal in size and contour. There is vas deferens calcification  bilaterally. There is fluid density attenuation throughout the scrotal regions tracking into the upper perineum on each side, likely sizable hydroceles. Other: There is increased soft tissue stranding in the right inferior inguinal region tracking toward the scrotum. There is more ill-defined fluid in this area which is increased in size compared to the previous study, measuring 7.6 x 3.4 cm. Soft tissue stranding extends to the level of the upper scrotum, although fluid does not extend to this distance. There are nearby mildly prominent lymph nodes which likely are of reactive/inflammatory etiology. No similar changes are noted elsewhere in the pelvis. Visualized portions of the appendix appear normal. Within the peritoneum and retroperitoneum of the pelvis, there is no abscess or ascites. Musculoskeletal: No blastic or lytic bone lesions evident. No fracture or dislocation. No intramuscular lesions are demonstrable. IMPRESSION: 1. Apparent phlegmon tracking in the medial inguinal region with surrounding soft tissue stranding and cellulitis. Fluid in this area has increased in volume and become more irregular in  contour. There is no frank air in this collection. Suspect developing phlegmon in this area. Fluid tracks to the anterior most aspect of the perineum. Nearby lymph nodes are probably of inflammatory etiology. 2. Sizable apparent scrotal hydroceles bilaterally with fluid attenuation in these apparent hydroceles. Fluid tracks into the inferior perineum on both sides. 3. There is no ascites or abscess within the peritoneum or retroperitoneum of the pelvis. Visualized appendix appears normal. No bony destruction or erosion. These results will be called to the ordering clinician or representative by the Radiologist Assistant, and communication documented in the PACS or zVision Dashboard. Electronically Signed   By: Lowella Grip III M.D.   On: 08/25/2018 10:36   Dg Chest Port 1 View  Result Date:  08/25/2018 CLINICAL DATA:  Possible pulmonary edema, status post I and D EXAM: PORTABLE CHEST 1 VIEW COMPARISON:  07/12/2016 FINDINGS: Very low volume AP portable examination with probable small layering pleural effusions. There is bronchovascular crowding without overt pulmonary edema. There is no focal airspace opacity. Consider PA and lateral radiographs to further evaluate. IMPRESSION: Very low volume AP portable examination with probable small layering pleural effusions. There is bronchovascular crowding without overt pulmonary edema. There is no focal airspace opacity. Consider PA and lateral radiographs to further evaluate. Electronically Signed   By: Eddie Candle M.D.   On: 08/25/2018 19:21   ASSESSMENT  Right inguinal abscess status post incision and drainage Uncontrolled type 2 diabetes Acute hypoxic respiratory failure secondary to anesthesia Agitation Bladder spasms   PLAN Hemodynamic monitoring per ICU protocol Urology following, plan is to continue antibiotics, keep Foley in place and reinforce dressing if soaked. Monitor fever curve Continue IV vancomycin Morphine for pain Continues BiPAP and titrate to nasal cannula as tolerated Low-dose Precedex infusion for agitation.  Patient was given 10 mg of Haldol postprocedure. Flomax 0.4 mg every afternoon for bladder spasms while Foley in place.  Discontinue after Foley is removed  Best Practice: Code Status: Full code Diet: N.p.o. overnight and resume carb modified diet in the morning GI prophylaxis: Not indicated VTE prophylaxis: SCDs and subcu Lovenox  FAMILY  - Updates: Patient's wife updated by phone.  All questions answered  Magdalene S. Tukov-Yual ANP-BC Pulmonary and [place] Pager [phone] or [phone]  NB: This document was prepared using Dragon voice recognition software and may include unintentional dictation errors.    08/25/2018, 9:00 PM

## 2018-08-25 NOTE — Op Note (Signed)
Preoperative diagnosis: Right inguinal abscess  Postoperative diagnosis: Right inguinal abscess   Procedure: Incision and drainage right inguinal abscess 8 cm  Surgeon: Junious Silk  Anesthesia: General  Indication for procedure: 43 year old African-American male with some fluid deep in the right inguinal region which has worsened over the past few days.  He was brought today for I&D.  Findings: Deep right inguinal abscess with thickened indurated skin surrounding it.  Through the incision the depth was 5 cm superiorly and 6 cm inferiorly.  The skin and subcutaneous tissues appeared normal.  There was no necrosis.  Also the penis and scrotum had simple edema.  The perineum was palpably and visibly normal.  By the end of the case much of the penile edema had resolved.  I could not palpate any inguinal hernia.  Description of procedure: After consent was obtained patient brought to the operating room.  After adequate anesthesia he was placed in lithotomy position and the external genitalia inguinal region and thighs prepped and draped in the usual sterile fashion.  A timeout was performed to confirm the patient and procedure.  I took a photo before we draped the patient of the swelling in the right inguinal region.  I was able to palpate an area of fluctuance in the typical sub-inguinal location and lateral to the spermatic cord over near the crease in between the thigh.  I marked this area and then brought the incision medially toward the cord.  The cord was palpable and inferior and medial.  I then injected the planned sub-inguinal incision with lidocaine and epinephrine.  I then aspirated through the fluctuant location and aspirated pus.  I then made an incision with an 11 blade and pus began to drain.  I passed a finger in superiorly and inferiorly and then inserted the swabs for a wound culture.  The skin was quite indurated and I worked it medially.  To assist with packing I ended up taking an ellipse  of skin superiorly and inferiorly from the original incision to open it up a bit.  Also extended the incision slightly superiorly and laterally.  Now I could easily get my finger in and then went to a depth of 5 cm superiorly and 6 cm inferiorly.  This cavity was lateral to the spermatic cord and the cord was uninvolved.  The cord was palpably more inferior and medial and palpable going up toward the external ring. The induration was over the abscess involving the skin.  I then irrigated the cavity thoroughly.  Inspection deep into the wound noted the subcutaneous tissues to be healthy.  I then took a Kerlix wet-to-dry and packed it superiorly and inferiorly.  This was covered with an ABD.  I placed a Foley catheter for wake up and overnight drainage.  We will DC the Foley in the morning.  Urine was clear.  Mesh underwear was placed.  He was awakened and taken to the recovery room in stable condition.  Complications: None  Blood loss: Minimal  Specimens to lab: Wound culture, right inguinal abscess  Drains: None  Disposition: Patient stable to PACU,  In the recovery room he was very agitated.  He was combative and tried to get out of bed.  He required 10 of Haldol and BiPAP.  He then settled down, sats went back up and tachycardia resolved.

## 2018-08-25 NOTE — Transfer of Care (Signed)
Immediate Anesthesia Transfer of Care Note  Patient: Micheal Robinson.  Procedure(s) Performed: INCISION AND DRAINAGE ABSCESS (Right )  Patient Location: PACU  Anesthesia Type:General  Level of Consciousness: awake, alert  and oriented  Airway & Oxygen Therapy: Patient Spontanous Breathing and Patient connected to face mask oxygen  Post-op Assessment: Report given to RN and Post -op Vital signs reviewed and stable  Post vital signs: Reviewed and stable  Last Vitals:  Vitals Value Taken Time  BP 182/107 08/25/2018  6:33 PM  Temp    Pulse 124 08/25/2018  6:33 PM  Resp 36 08/25/2018  6:33 PM  SpO2 94 % 08/25/2018  6:33 PM  Vitals shown include unvalidated device data.  Last Pain:  Vitals:   08/25/18 1552  TempSrc: Oral  PainSc:       Patients Stated Pain Goal: 0 (74/82/70 [id number])  Complications: No apparent anesthesia complications

## 2018-08-26 ENCOUNTER — Encounter: Payer: Self-pay | Admitting: Urology

## 2018-08-26 DIAGNOSIS — L02214 Cutaneous abscess of groin: Secondary | ICD-10-CM

## 2018-08-26 LAB — BASIC METABOLIC PANEL
Anion gap: 10 (ref 5–15)
BUN: 16 mg/dL (ref 6–20)
CO2: 28 mmol/L (ref 22–32)
Calcium: 8.7 mg/dL — ABNORMAL LOW (ref 8.9–10.3)
Chloride: 99 mmol/L (ref 98–111)
Creatinine, Ser: 2.05 mg/dL — ABNORMAL HIGH (ref 0.61–1.24)
GFR calc Af Amer: 45 mL/min — ABNORMAL LOW (ref >60)
GFR calc non Af Amer: 39 mL/min — ABNORMAL LOW (ref >60)
Glucose, Bld: 226 mg/dL — ABNORMAL HIGH (ref 70–99)
Potassium: 4.7 mmol/L (ref 3.5–5.1)
Sodium: 137 mmol/L (ref 135–145)

## 2018-08-26 LAB — CBC
HCT: 39.3 % (ref 39.0–52.0)
Hemoglobin: 12.8 g/dL — ABNORMAL LOW (ref 13.0–17.0)
MCH: 27.1 pg (ref 26.0–34.0)
MCHC: 32.6 g/dL (ref 30.0–36.0)
MCV: 83.1 fL (ref 80.0–100.0)
Platelets: 240 10*3/uL (ref 150–400)
RBC: 4.73 MIL/uL (ref 4.22–5.81)
RDW: 14.1 % (ref 11.5–15.5)
WBC: 8.5 10*3/uL (ref 4.0–10.5)
nRBC: 0 % (ref 0.0–0.2)

## 2018-08-26 LAB — GLUCOSE, CAPILLARY
Glucose-Capillary: 286 mg/dL — ABNORMAL HIGH (ref 70–99)
Glucose-Capillary: 208 mg/dL — ABNORMAL HIGH (ref 70–99)
Glucose-Capillary: 204 mg/dL — ABNORMAL HIGH (ref 70–99)
Glucose-Capillary: 326 mg/dL — ABNORMAL HIGH (ref 70–99)

## 2018-08-26 LAB — PROCALCITONIN: Procalcitonin: 2.19 ng/mL

## 2018-08-26 MED ORDER — VANCOMYCIN HCL 10 G IV SOLR
1500.0000 mg | INTRAVENOUS | Status: DC
  Filled 2018-08-26: qty 1500

## 2018-08-26 MED ORDER — VANCOMYCIN HCL 1.5 G IV SOLR
1500.0000 mg | INTRAVENOUS | Status: DC
  Administered 2018-08-26: 1500 mg via INTRAVENOUS
  Filled 2018-08-26: qty 1500

## 2018-08-26 MED ORDER — SODIUM CHLORIDE 0.9 % IV SOLN
INTRAVENOUS | Status: DC
  Administered 2018-08-26: 14:00:00 via INTRAVENOUS

## 2018-08-26 NOTE — Plan of Care (Signed)
Problem: Education: Goal: Knowledge of General Education information will improve Description Including pain rating scale, medication(s)/side effects and non-pharmacologic comfort measures Outcome: Progressing   Problem: Health Behavior/Discharge Planning: Goal: Ability to manage health-related needs will improve Outcome: Progressing   Problem: Clinical Measurements: Goal: Ability to maintain clinical measurements within normal limits will improve Outcome: Progressing Goal: Will remain free from infection Outcome: Progressing Goal: Diagnostic test results will improve Outcome: Progressing Goal: Respiratory complications will improve Outcome: Progressing Goal: Cardiovascular complication will be avoided Outcome: Progressing   Problem: Activity: Goal: Risk for activity intolerance will decrease Outcome: Progressing   Problem: Nutrition: Goal: Adequate nutrition will be maintained Outcome: Progressing   Problem: Coping: Goal: Level of anxiety will decrease Outcome: Progressing   Problem: Elimination: Goal: Will not experience complications related to bowel motility Outcome: Progressing Goal: Will not experience complications related to urinary retention Outcome: Progressing   Problem: Elimination: Goal: Will not experience complications related to urinary retention Outcome: Progressing   Problem: Pain Managment: Goal: General experience of comfort will improve Outcome: Progressing   Problem: Safety: Goal: Ability to remain free from injury will improve Outcome: Progressing   Problem: Skin Integrity: Goal: Risk for impaired skin integrity will decrease Outcome: Progressing   Problem: Education: Goal: Knowledge of General Education information will improve Description Including pain rating scale, medication(s)/side effects and non-pharmacologic comfort measures Outcome: Progressing   Problem: Health Behavior/Discharge Planning: Goal: Ability to manage  health-related needs will improve Outcome: Progressing   Problem: Clinical Measurements: Goal: Ability to maintain clinical measurements within normal limits will improve Outcome: Progressing Goal: Will remain free from infection Outcome: Progressing Goal: Diagnostic test results will improve Outcome: Progressing Goal: Respiratory complications will improve Outcome: Progressing Goal: Cardiovascular complication will be avoided Outcome: Progressing   Problem: Activity: Goal: Risk for activity intolerance will decrease Outcome: Progressing   Problem: Nutrition: Goal: Adequate nutrition will be maintained Outcome: Progressing   Problem: Coping: Goal: Level of anxiety will decrease Outcome: Progressing   Problem: Elimination: Goal: Will not experience complications related to bowel motility Outcome: Progressing Goal: Will not experience complications related to urinary retention Outcome: Progressing   Problem: Pain Managment: Goal: General experience of comfort will improve Outcome: Progressing   Problem: Safety: Goal: Ability to remain free from injury will improve Outcome: Progressing   Problem: Skin Integrity: Goal: Risk for impaired skin integrity will decrease Outcome: Progressing   Problem: Education: Goal: Ability to describe self-care measures that may prevent or decrease complications (Diabetes Survival Skills Education) will improve Outcome: Progressing Goal: Individualized Educational Video(s) Outcome: Progressing   Problem: Coping: Goal: Ability to adjust to condition or change in health will improve Outcome: Progressing   Problem: Fluid Volume: Goal: Ability to maintain a balanced intake and output will improve Outcome: Progressing   Problem: Health Behavior/Discharge Planning: Goal: Ability to identify and utilize available resources and services will improve Outcome: Progressing Goal: Ability to manage health-related needs will  improve Outcome: Progressing   Problem: Metabolic: Goal: Ability to maintain appropriate glucose levels will improve Outcome: Progressing   Problem: Nutritional: Goal: Maintenance of adequate nutrition will improve Outcome: Progressing Goal: Progress toward achieving an optimal weight will improve Outcome: Progressing   Problem: Skin Integrity: Goal: Risk for impaired skin integrity will decrease Outcome: Progressing   Problem: Tissue Perfusion: Goal: Adequacy of tissue perfusion will improve Outcome: Progressing   Problem: Education: Goal: Ability to describe self-care measures that may prevent or decrease complications (Diabetes Survival Skills Education) will improve Outcome: Progressing Goal: Individualized Educational  Video(s) Outcome: Progressing   Problem: Coping: Goal: Ability to adjust to condition or change in health will improve Outcome: Progressing   Problem: Fluid Volume: Goal: Ability to maintain a balanced intake and output will improve Outcome: Progressing   Problem: Health Behavior/Discharge Planning: Goal: Ability to identify and utilize available resources and services will improve Outcome: Progressing Goal: Ability to manage health-related needs will improve Outcome: Progressing   Problem: Metabolic: Goal: Ability to maintain appropriate glucose levels will improve Outcome: Progressing   Problem: Nutritional: Goal: Maintenance of adequate nutrition will improve Outcome: Progressing Goal: Progress toward achieving an optimal weight will improve Outcome: Progressing   Problem: Skin Integrity: Goal: Risk for impaired skin integrity will decrease Outcome: Progressing   Problem: Tissue Perfusion: Goal: Adequacy of tissue perfusion will improve Outcome: Progressing

## 2018-08-26 NOTE — Progress Notes (Addendum)
[employer] - [place] at [facility]                                                                                                                                                                                  Patient Demographics   Micheal Robinson, is a 43 y.o. male, DOB - 1975-05-06, ZOX:[id number]  Admit date - 08/21/2018   Admitting Physician Milagros Loll, MD  Outpatient Primary MD for the patient is [facility]   LOS - 5  Subjective: Patient was agitated and in respiratory distress yesterday afternoon after his surgery.  He was transferred to the ICU on BiPAP.  Received Precedex. Patient states he is doing better this morning.  He has been pretty sleepy since he has been in the ICU, but he feels like the swelling in his right groin has much improved.  Review of Systems:   CONSTITUTIONAL: No documented fever. No fatigue, weakness. No weight gain, no weight loss.  EYES: No blurry or double vision.  ENT: No tinnitus. No postnasal drip. No redness of the oropharynx.  RESPIRATORY: No cough, no wheeze, no hemoptysis. No dyspnea.  CARDIOVASCULAR: No chest pain. No orthopnea. No palpitations. No syncope.  GASTROINTESTINAL: No nausea, no vomiting or diarrhea. No abdominal pain. No melena or hematochezia.  GENITOURINARY: No dysuria or hematuria.  Positive right groin pain ENDOCRINE: No polyuria or nocturia. No heat or cold intolerance.  HEMATOLOGY: No anemia. No bruising. No bleeding.  INTEGUMENTARY: No rashes. No lesions.  MUSCULOSKELETAL: +Right leg pain. +groin pain. + groin swelling. NEUROLOGIC: No numbness, tingling, or ataxia. No seizure-type activity.  PSYCHIATRIC: No anxiety. No insomnia. No ADD.    Vitals:   Vitals:   08/26/18 0800 08/26/18 0900 08/26/18 1000 08/26/18 1100  BP: 102/71  115/77 122/82  Pulse: 82 86 100 94  Resp: (!) 21 (!) 26 (!) 23 20  Temp: 98.6 F (37 C)     TempSrc: Oral     SpO2: 97% 93% (!) 87% 97%  Weight:       Height:        Wt Readings from Last 3 Encounters:  08/25/18 (!) 137.3 kg  03/06/17 132.9 kg  07/12/16 (!) 142.9 kg     Intake/Output Summary (Last 24 hours) at 08/26/2018 1214 Last data filed at 08/26/2018 1100 Gross per 24 hour  Intake 2971.73 ml  Output 1905 ml  Net 1066.73 ml    Physical Exam:   GENERAL: Pleasant-appearing in no apparent distress.  HEENT: Atraumatic, normocephalic. Extraocular muscles are intact. Pupils equal and reactive to light. Sclerae anicteric. No conjunctival injection. No oro-pharyngeal erythema.  NECK: Supple. There is no jugular venous distention.  No bruits, no lymphadenopathy, no thyromegaly.  HEART: Regular rate and rhythm,. No murmurs, no rubs, no clicks.  LUNGS: Clear to auscultation bilaterally. No rales or rhonchi. No wheezes.  ABDOMEN: Soft, flat, nontender, nondistended. Has good bowel sounds. No hepatosplenomegaly appreciated.  GENITOURINARY: Right inguinal area over the region of the mons pubis is indurated with some new fluctuance noted today. +scrotal edema present. EXTREMITIES: No evidence of any cyanosis, clubbing, or peripheral edema.  +2 pedal and radial pulses bilaterally.  NEUROLOGIC: The patient is alert, awake, and oriented x3 with no focal motor or sensory deficits appreciated bilaterally.  SKIN: Moist and warm with no rashes appreciated.  Psych: Not anxious, depressed   Antibiotics   Anti-infectives (From admission, onward)   Start     Dose/Rate Route Frequency Ordered Stop   08/24/18 1600  piperacillin-tazobactam (ZOSYN) IVPB 3.375 g     3.375 g 12.5 mL/hr over 240 Minutes Intravenous Every 8 hours 08/24/18 1340     08/23/18 1800  vancomycin (VANCOCIN) 2,000 mg in sodium chloride 0.9 % 500 mL IVPB     2,000 mg 250 mL/hr over 120 Minutes Intravenous Every 24 hours 08/23/18 0716     08/22/18 1800  vancomycin (VANCOCIN) 1,500 mg in sodium chloride 0.9 % 500 mL IVPB  Status:  Discontinued     1,500 mg 250 mL/hr over 120  Minutes Intravenous Every 24 hours 08/21/18 1708 08/23/18 0716   08/21/18 1715  vancomycin (VANCOCIN) 2,500 mg in sodium chloride 0.9 % 500 mL IVPB     2,500 mg 250 mL/hr over 120 Minutes Intravenous  Once 08/21/18 1708 08/21/18 2054   08/21/18 1715  Ampicillin-Sulbactam (UNASYN) 3 g in sodium chloride 0.9 % 100 mL IVPB  Status:  Discontinued     3 g 200 mL/hr over 30 Minutes Intravenous Every 6 hours 08/21/18 1708 08/24/18 1317   08/21/18 1500  piperacillin-tazobactam (ZOSYN) IVPB 3.375 g     3.375 g 100 mL/hr over 30 Minutes Intravenous  Once 08/21/18 1446 08/21/18 1543      Medications   Scheduled Meds: . atorvastatin  80 mg Oral q1800  . enoxaparin (LOVENOX) injection  40 mg Subcutaneous Q12H  . insulin aspart  0-20 Units Subcutaneous TID WC  . insulin aspart  0-5 Units Subcutaneous QHS  . insulin aspart  6 Units Subcutaneous TID WC  . insulin glargine  50 Units Subcutaneous Daily  . lidocaine-EPINEPHrine  10 mL Intradermal Once  . lisinopril  10 mg Oral Daily  . mouth rinse  15 mL Mouth Rinse BID  . tamsulosin  0.4 mg Oral QPC supper   Continuous Infusions: . sodium chloride Stopped (08/24/18 0450)  . piperacillin-tazobactam (ZOSYN)  IV 3.375 g (08/26/18 0545)  . vancomycin Stopped (08/24/18 2226)   PRN Meds:.sodium chloride, acetaminophen **OR** acetaminophen, morphine injection, ondansetron **OR** ondansetron (ZOFRAN) IV, oxyCODONE-acetaminophen, polyethylene glycol   Data Review:   Micro Results Recent Results (from the past 240 hour(s))  SARS Coronavirus 2 (CEPHEID - Performed in [facility] hospital lab), Hosp Order     Status: None   Collection Time: 08/21/18  3:54 PM  Result Value Ref Range Status   SARS Coronavirus 2 NEGATIVE NEGATIVE Final    Comment: (NOTE) If result is NEGATIVE SARS-CoV-2 target nucleic acids are NOT DETECTED. The SARS-CoV-2 RNA is generally detectable in upper and lower  respiratory specimens during the acute phase of infection. The  lowest  concentration of SARS-CoV-2 viral copies this assay can detect is 250  copies /  mL. A negative result does not preclude SARS-CoV-2 infection  and should not be used as the sole basis for treatment or other  patient management decisions.  A negative result may occur with  improper specimen collection / handling, submission of specimen other  than nasopharyngeal swab, presence of viral mutation(s) within the  areas targeted by this assay, and inadequate number of viral copies  (<250 copies / mL). A negative result must be combined with clinical  observations, patient history, and epidemiological information. If result is POSITIVE SARS-CoV-2 target nucleic acids are DETECTED. The SARS-CoV-2 RNA is generally detectable in upper and lower  respiratory specimens dur ing the acute phase of infection.  Positive  results are indicative of active infection with SARS-CoV-2.  Clinical  correlation with patient history and other diagnostic information is  necessary to determine patient infection status.  Positive results do  not rule out bacterial infection or co-infection with other viruses. If result is PRESUMPTIVE POSTIVE SARS-CoV-2 nucleic acids MAY BE PRESENT.   A presumptive positive result was obtained on the submitted specimen  and confirmed on repeat testing.  While 2019 novel coronavirus  (SARS-CoV-2) nucleic acids may be present in the submitted sample  additional confirmatory testing may be necessary for epidemiological  and / or clinical management purposes  to differentiate between  SARS-CoV-2 and other Sarbecovirus currently known to infect humans.  If clinically indicated additional testing with an alternate test  methodology [phone]([id number]) is advised. The SARS-CoV-2 RNA is generally  detectable in upper and lower respiratory sp ecimens during the acute  phase of infection. The expected result is Negative. Fact Sheet for Patients:   BoilerBrush.com.cyhttps://www.fda.gov/media/136312/download Fact Sheet for Healthcare Providers: https://pope.com/https://www.fda.gov/media/136313/download This test is not yet approved or cleared by the [place] FDA and has been authorized for detection and/or diagnosis of SARS-CoV-2 by FDA under an Emergency Use Authorization (EUA).  This EUA will remain in effect (meaning this test can be used) for the duration of the COVID-19 declaration under Section 564(b)(1) of the Act, 21 U.S.C. section 360bbb-3(b)(1), unless the authorization is terminated or revoked sooner. Performed at  [facility], [address] [place], [place] [address]   Aerobic/Anaerobic Culture (surgical/deep wound)     Status: None (Preliminary result)   Collection Time: 08/25/18  5:54 PM  Result Value Ref Range Status   Specimen Description   Final    WOUND Performed at [facility], [address] [place], [place] [address]    Special Requests   Final    NONE Performed at [facility], [address] [place], [place] [address]    Gram Stain   Final    NO WBC SEEN RARE GRAM POSITIVE COCCI Performed at [facility], [address] [place], [place] [address]    Culture PENDING  Incomplete   Report Status PENDING  Incomplete  MRSA PCR Screening     Status: None   Collection Time: 08/25/18  8:06 PM  Result Value Ref Range Status   MRSA by PCR NEGATIVE NEGATIVE Final    Comment:        The GeneXpert MRSA Assay (FDA approved for NASAL specimens only), is one component of a comprehensive MRSA colonization surveillance program. It is not intended to diagnose MRSA infection nor to guide or monitor treatment for MRSA infections. Performed at [facility], [address] [place], [place] [address]     Radiology Reports Ct Abdomen Pelvis Wo Contrast  Result Date: 08/21/2018 CLINICAL DATA:  Abdominal pain and fever. Nausea and vomiting. Right inguinal region swelling. EXAM: CT ABDOMEN AND  PELVIS WITHOUT CONTRAST TECHNIQUE: Multidetector CT imaging of the abdomen and pelvis was performed following the standard protocol without oral or IV contrast. COMPARISON:  None. FINDINGS: Lower chest: There is atelectatic change in the lung bases. No lung base edema or consolidation is evident. Hepatobiliary: No focal liver lesions are appreciable on this noncontrast enhanced study. The gallbladder wall is not appreciably thickened. There is no biliary duct dilatation. Pancreas: There is no evident pancreatic mass or inflammatory focus. Spleen: No splenic lesions are evident. Adrenals/Urinary Tract: Adrenals bilaterally appear unremarkable. Kidneys bilaterally show no evident mass or hydronephrosis on either side. There is no evident renal or ureteral calculus on either side. Urinary bladder is midline with wall thickness within normal limits. Stomach/Bowel: There is no appreciable bowel wall or mesenteric thickening. Terminal ileum appears normal. There is no bowel obstruction. There is no evident free air or portal venous air. Vascular/Lymphatic: There is no abdominal aortic aneurysm. No vascular lesions are evident. Reproductive: There are prominent inguinal lymph nodes in the medial lower pelvis and upper thigh region with surrounding soft tissue stranding. The largest lymph node in this area measures 2.4 x 1.5 cm. There is subcutaneous thickening of the lower pelvic/upper thigh region wall in the anterior right lower inguinal region. There is no frank fluid collection or abscess. No abnormal air in this area. No bowel containing hernia in this area. Elsewhere, there are several borderline prominent inguinal lymph nodes which have central fatty hila, suggesting benign etiology. There are a few subcentimeter common femoral artery lymph nodes which do not meet size criteria for pathologic significance. No adenopathy based on size criteria noted within the peritoneum or retroperitoneum of the abdomen and pelvis.  Other: Appendix appears normal. No abscess or ascites evident in the abdomen or pelvis. Musculoskeletal: No blastic or lytic bone lesions. No intramuscular lesions are evident. IMPRESSION: 1. Localized adenopathy and soft tissue stranding with subcutaneous thickening in the medial right lower inguinal and proximal thigh region extending to the upper scrotal sac. No associated fluid or frank abscess. No air in this area. There is inflammatory change which may be due to a combination of adenitis and panniculitis. No inflammatory change of this nature seen elsewhere on this study 2. No evident bowel obstruction. No abscess in the abdomen or pelvis. Appendix appears normal. No evident hernia. 3. No evident renal or ureteral calculus. No hydronephrosis. Urinary bladder wall thickness is normal. Electronically Signed   By: Lowella Grip III M.D.   On: 08/21/2018 14:37   Ct Pelvis Wo Contrast  Result Date: 08/25/2018 CLINICAL DATA:  Right inguinal cellulitis/inflammation EXAM: CT PELVIS WITHOUT CONTRAST TECHNIQUE: Multidetector CT imaging of the pelvis was performed following the standard protocol without intravenous contrast. COMPARISON:  August 21, 2018 FINDINGS: Urinary Tract: The urinary bladder wall thickness is normal. No ureteral abnormality in the regions which are visualized. Bowel:  No evident bowel obstruction.  No bowel wall thickening. Vascular/Lymphatic: There are scattered foci arterial vascular calcification. No distal abdominal aortic or pelvic arterial aneurysm. Reproductive: Prostate and seminal vesicles appear normal in size and contour. There is vas deferens calcification bilaterally. There is fluid density attenuation throughout the scrotal regions tracking into the upper perineum on each side, likely sizable hydroceles. Other: There is increased soft tissue stranding in the right inferior inguinal region tracking toward the scrotum. There is more ill-defined fluid in this area which is increased  in  size compared to the previous study, measuring 7.6 x 3.4 cm. Soft tissue stranding extends to the level of the upper scrotum, although fluid does not extend to this distance. There are nearby mildly prominent lymph nodes which likely are of reactive/inflammatory etiology. No similar changes are noted elsewhere in the pelvis. Visualized portions of the appendix appear normal. Within the peritoneum and retroperitoneum of the pelvis, there is no abscess or ascites. Musculoskeletal: No blastic or lytic bone lesions evident. No fracture or dislocation. No intramuscular lesions are demonstrable. IMPRESSION: 1. Apparent phlegmon tracking in the medial inguinal region with surrounding soft tissue stranding and cellulitis. Fluid in this area has increased in volume and become more irregular in contour. There is no frank air in this collection. Suspect developing phlegmon in this area. Fluid tracks to the anterior most aspect of the perineum. Nearby lymph nodes are probably of inflammatory etiology. 2. Sizable apparent scrotal hydroceles bilaterally with fluid attenuation in these apparent hydroceles. Fluid tracks into the inferior perineum on both sides. 3. There is no ascites or abscess within the peritoneum or retroperitoneum of the pelvis. Visualized appendix appears normal. No bony destruction or erosion. These results will be called to the ordering clinician or representative by the Radiologist Assistant, and communication documented in the PACS or zVision Dashboard. Electronically Signed   By: Bretta BangWilliam  Woodruff III M.D.   On: 08/25/2018 10:36   Ct Tibia Fibula Right Wo Contrast  Result Date: 08/23/2018 CLINICAL DATA:  Right shin pain and discoloration in a diabetic patient with severe hyperglycemia. Patient admitted to the hospital for right groin cellulitis 08/21/2018. EXAM: CT OF THE LOWER RIGHT EXTREMITY WITHOUT CONTRAST TECHNIQUE: Multidetector CT imaging of the right lower extremity was performed according  to the standard protocol. COMPARISON:  None. FINDINGS: Bones/Joint/Cartilage No fracture. No periosteal reaction or bony destructive change. Small bone island in the lateral epicondyle of the distal femur incidentally noted. Osteochondral lesion of the medial talar dome measures 0.6 cm transverse by 1.1 cm AP. Ligaments Suboptimally assessed by CT. Muscles and Tendons Intact. No intramuscular fluid collection or gas within muscle or tracking along fascial planes is identified. No fluid collection or evidence of muscular edema. Soft tissues There is some subcutaneous edema about the lower leg. No soft tissue gas or focal fluid collection. No radiopaque foreign body. IMPRESSION: Subcutaneous edema about the lower leg could be due to cellulitis or dependent change. Negative for abscess. No evidence of osteomyelitis, myositis or fasciitis. Osteochondral lesion medial talar dome. Electronically Signed   By: Drusilla Kannerhomas  Dalessio M.D.   On: 08/23/2018 15:43   [place] Venous Img Lower Unilateral Right  Result Date: 08/21/2018 CLINICAL DATA:  Right lower extremity pain and edema for the past 2 weeks. History of smoking. Evaluate for DVT. EXAM: RIGHT LOWER EXTREMITY VENOUS DOPPLER ULTRASOUND TECHNIQUE: Gray-scale sonography with graded compression, as well as color Doppler and duplex ultrasound were performed to evaluate the lower extremity deep venous systems from the level of the common femoral vein and including the common femoral, femoral, profunda femoral, popliteal and calf veins including the posterior tibial, peroneal and gastrocnemius veins when visible. The superficial great saphenous vein was also interrogated. Spectral Doppler was utilized to evaluate flow at rest and with distal augmentation maneuvers in the common femoral, femoral and popliteal veins. COMPARISON:  None. FINDINGS: Contralateral Common Femoral Vein: Respiratory phasicity is normal and symmetric with the symptomatic side. No evidence of thrombus. Normal  compressibility. Common Femoral Vein: No evidence of thrombus. Normal compressibility, respiratory  phasicity and response to augmentation. Saphenofemoral Junction: No evidence of thrombus. Normal compressibility and flow on color Doppler imaging. Profunda Femoral Vein: No evidence of thrombus. Normal compressibility and flow on color Doppler imaging. Femoral Vein: No evidence of thrombus. Normal compressibility, respiratory phasicity and response to augmentation. Popliteal Vein: No evidence of thrombus. Normal compressibility, respiratory phasicity and response to augmentation. Calf Veins: No evidence of thrombus. Normal compressibility and flow on color Doppler imaging. Superficial Great Saphenous Vein: No evidence of thrombus. Normal compressibility. Venous Reflux:  None. Other Findings:  None. IMPRESSION: No evidence of DVT within the right lower extremity. Electronically Signed   By: Simonne Come M.D.   On: 08/21/2018 14:00   Dg Chest Port 1 View  Result Date: 08/25/2018 CLINICAL DATA:  Possible pulmonary edema, status post I and D EXAM: PORTABLE CHEST 1 VIEW COMPARISON:  07/12/2016 FINDINGS: Very low volume AP portable examination with probable small layering pleural effusions. There is bronchovascular crowding without overt pulmonary edema. There is no focal airspace opacity. Consider PA and lateral radiographs to further evaluate. IMPRESSION: Very low volume AP portable examination with probable small layering pleural effusions. There is bronchovascular crowding without overt pulmonary edema. There is no focal airspace opacity. Consider PA and lateral radiographs to further evaluate. Electronically Signed   By: Lauralyn Primes M.D.   On: 08/25/2018 19:21     CBC Recent Labs  Lab 08/21/18 1253 08/22/18 0316 08/23/18 0242 08/25/18 0634 08/26/18 0539  WBC 10.9* 10.2 9.6 7.4 8.5  HGB 14.5 13.3 13.7 13.2 12.8*  HCT 42.8 40.1 41.8 40.1 39.3  PLT 203 185 189 217 240  MCV 81.5 82.5 84.8 82.9 83.1  MCH  27.6 27.4 27.8 27.3 27.1  MCHC 33.9 33.2 32.8 32.9 32.6  RDW 14.1 14.0 14.1 13.8 14.1  LYMPHSABS 1.2  --   --   --   --   MONOABS 1.1*  --   --   --   --   EOSABS 0.3  --   --   --   --   BASOSABS 0.0  --   --   --   --     Chemistries  Recent Labs  Lab 08/21/18 1253 08/22/18 0316 08/23/18 0242 08/25/18 0634 08/26/18 0539  NA 129* 137 139 136 137  K 4.1 4.0 4.2 4.2 4.7  CL 93* 106 106 103 99  CO2 GLUCOSE 661* 272* 256* 305* 226*  BUN 24* 22* CREATININE 2.04* 1.79* 1.64* 1.64* 2.05*  CALCIUM 7.8* 7.7* 8.0* 8.6* 8.7*  AST 19  --   --   --   --   ALT 22  --   --   --   --   ALKPHOS 71  --   --   --   --   BILITOT 0.6  --   --   --   --    ------------------------------------------------------------------------------------------------------------------ estimated creatinine clearance is 66.7 mL/min (A) (by C-G formula based on SCr of 2.05 mg/dL (H)). ------------------------------------------------------------------------------------------------------------------ No results for input(s): HGBA1C in the last 72 hours. ------------------------------------------------------------------------------------------------------------------ No results for input(s): CHOL, HDL, LDLCALC, TRIG, CHOLHDL, LDLDIRECT in the last 72 hours. ------------------------------------------------------------------------------------------------------------------ No results for input(s): TSH, T4TOTAL, T3FREE, THYROIDAB in the last 72 hours.  Invalid input(s): FREET3 ------------------------------------------------------------------------------------------------------------------ No results for input(s): VITAMINB12, FOLATE, FERRITIN, TIBC, IRON, RETICCTPCT in the last 72 hours.  Coagulation profile No results for input(s): INR, PROTIME in the last 168 hours.  No  results for input(s): DDIMER in the last 72 hours.  Cardiac Enzymes No results for input(s): CKMB, TROPONINI, MYOGLOBIN  in the last 168 hours.  Invalid input(s): CK ------------------------------------------------------------------------------------------------------------------ Invalid input(s): POCBNP    Assessment & Plan   Right groin abscess/cellulitis- s/p I&D on 6/7. Likely related to uncontrolled type 2 diabetes. -Continue IV vancomycin and zosyn -Follow-up intraoperative wound cultures- gram stain with rare GPCs -Urology following-recommend that packing be changed daily -Will need wound care on discharge -Foley to be removed today  Severe hyperglycemia in type 2 diabetes- blood sugars remain elevated. A1c > 15.5% this admission. -Continue Lantus to 50 units daily -Resistant SSI  Right shin pain- likely related to his right groin cellulitis.  -CT of right tib/fib with subcutaneous edema in the lower leg but can be cellulitis or dependent change -Continue IV abx as above  AKI in CKD stage III- creatinine bumped today -Start IVFs -Hold lisinopril -Recheck creatinine in the morning  Hypertension- BPs improved -Hold lisinopril  Morbid obesity- weight loss recommended  DVT prophylaxis- lovenox  All the records are reviewed and case discussed with ED provider. Management plans discussed with the patient, family and they are in agreement.  CODE STATUS: Full code      Code Status Orders  (From admission, onward)         Start     Ordered   08/21/18 1632  Full code  Continuous     08/21/18 1631        Code Status History    Date Active Date Inactive Code Status Order ID Comments User Context   08/31/2015 2323 09/01/2015 2035 Full Code [id number]  Arnaldo Natal, MD Inpatient      Consults urology  DVT Prophylaxis  Lovenox  Lab Results  Component Value Date   PLT 240 08/26/2018     Time Spent in minutes 45 minutes  Greater than 50% of time spent in care coordination and counseling patient regarding the condition and plan of care.   Jinny Blossom Jennika Ringgold M.D on  08/26/2018 at 12:14 PM  Between 7am to 6pm - Pager - [phone]  After 6pm go to [email or web address] - Social research officer, government  [employer]   Office  [phone]

## 2018-08-26 NOTE — Progress Notes (Signed)
Patient states that he is "...not wearing the Bipap.Marland KitchenMarland KitchenI can't breathe with it on.. I am leaving in the morning.Marland KitchenMarland KitchenMarland KitchenI am going home to be with my family..."  Encouraged to keep O2 Sat. Up. Voiced understanding. Barbaraann Faster, RN 11:52 PM 08/26/2018

## 2018-08-26 NOTE — Progress Notes (Signed)
Report given to [place] RN. Patient also voided S/P Foley removal (see flowsheet). Patient moved to room 224.

## 2018-08-26 NOTE — Consult Note (Signed)
Pharmacy Antibiotic Note  Micheal Ouch. is a 43 y.o. male admitted on 08/21/2018 with cellulitis.  Pharmacy has been consulted for Zosyn and vancomycin dosing. He was originally started on vancomycin and Unasyn. Since admission his leucocytosis has resolved and he has been afebrile but he remains with a significant amount of pain and swelling. Renal function was improving but has bumped up today (baseline SCr 1.5 mg/dL). This is Zosyn day 3 and vancomycin day 6. He was noted to be clinically worsening yesterday but is somewhat improved today and being moved to the floor. Today is postop day 1 status post I&D of subinguinal abscess.   Plan: 1) decrease vancomycin dose to 1500 mg every 24 hours  Goal AUC 400-550. Expected AUC: 469 SCr used: 2.05  2) continue Zosyn 3.375g IV q8h (4 hour infusion)  Height: 6' (182.9 cm) Weight: (!) 302 lb 12.8 oz (137.3 kg) IBW/kg (Calculated) : 77.6  Temp (24hrs), Avg:99 F (37.2 C), Min:98.4 F (36.9 C), Max:100.4 F (38 C)  Recent Labs  Lab 08/21/18 1253 08/21/18 2219 08/22/18 0316 08/23/18 0242 08/25/18 0634 08/25/18 1016 08/26/18 0539  WBC 10.9*  --  10.2 9.6 7.4  --  8.5  CREATININE 2.04*  --  1.79* 1.64* 1.64*  --  2.05*  LATICACIDVEN  --  2.4*  --   --   --  1.1  --     Estimated Creatinine Clearance: 66.7 mL/min (A) (by C-G formula based on SCr of 2.05 mg/dL (H)).    Antimicrobials this admission: Zosyn 6/6 >>  vancomycin 6/5 >> Unasyn 6/5 >> 6/6  Microbiology results: 6/5 SARS CoV-2: negative   Thank you for allowing pharmacy to be a part of this patient's care.  Dallie Piles, PharmD 08/26/2018 7:51 AM

## 2018-08-26 NOTE — Progress Notes (Signed)
Urology Consult Follow Up  Subjective: Postop day 1 status post I&D of subinguinal abscess.  Taken to ICU overnight for BiPAP, now on room air this a.m.  Wound dressing/ packing already changed early this a.m which per patient report was well-tolerated with minimal discomfort.  Anxious for discharge, states his wife is a former [employer] scientist and can help with wound care.  Foley remains in place.  Anti-infectives: Anti-infectives (From admission, onward)   Start     Dose/Rate Route Frequency Ordered Stop   08/24/18 1600  piperacillin-tazobactam (ZOSYN) IVPB 3.375 g     3.375 g 12.5 mL/hr over 240 Minutes Intravenous Every 8 hours 08/24/18 1340     08/23/18 1800  vancomycin (VANCOCIN) 2,000 mg in sodium chloride 0.9 % 500 mL IVPB     2,000 mg 250 mL/hr over 120 Minutes Intravenous Every 24 hours 08/23/18 0716     08/22/18 1800  vancomycin (VANCOCIN) 1,500 mg in sodium chloride 0.9 % 500 mL IVPB  Status:  Discontinued     1,500 mg 250 mL/hr over 120 Minutes Intravenous Every 24 hours 08/21/18 1708 08/23/18 0716   08/21/18 1715  vancomycin (VANCOCIN) 2,500 mg in sodium chloride 0.9 % 500 mL IVPB     2,500 mg 250 mL/hr over 120 Minutes Intravenous  Once 08/21/18 1708 08/21/18 2054   08/21/18 1715  Ampicillin-Sulbactam (UNASYN) 3 g in sodium chloride 0.9 % 100 mL IVPB  Status:  Discontinued     3 g 200 mL/hr over 30 Minutes Intravenous Every 6 hours 08/21/18 1708 08/24/18 1317   08/21/18 1500  piperacillin-tazobactam (ZOSYN) IVPB 3.375 g     3.375 g 100 mL/hr over 30 Minutes Intravenous  Once 08/21/18 1446 08/21/18 1543      Current Facility-Administered Medications  Medication Dose Route Frequency Provider Last Rate Last Dose  . 0.9 %  sodium chloride infusion   Intravenous PRN Festus Aloe, MD   Stopped at 08/24/18 [phone]  . acetaminophen (TYLENOL) tablet 650 mg  650 mg Oral Q6H PRN Festus Aloe, MD   650 mg at 08/23/18 0107   Or  . acetaminophen (TYLENOL) suppository 650 mg  650 mg  Rectal Q6H PRN Festus Aloe, MD      . atorvastatin (LIPITOR) tablet 80 mg  80 mg Oral q1800 Festus Aloe, MD   80 mg at 08/24/18 1714  . enoxaparin (LOVENOX) injection 40 mg  40 mg Subcutaneous Q12H Festus Aloe, MD   40 mg at 08/26/18 0910  . insulin aspart (novoLOG) injection 0-20 Units  0-20 Units Subcutaneous TID WC Festus Aloe, MD   7 Units at 08/26/18 [phone]  . insulin aspart (novoLOG) injection 0-5 Units  0-5 Units Subcutaneous QHS Festus Aloe, MD   3 Units at 08/25/18 2112  . insulin aspart (novoLOG) injection 6 Units  6 Units Subcutaneous TID WC Festus Aloe, MD   6 Units at 08/26/18 [phone]  . insulin glargine (LANTUS) injection 50 Units  50 Units Subcutaneous Daily Festus Aloe, MD      . lidocaine-EPINEPHrine (XYLOCAINE W/EPI) 1 %-1:100000 (with pres) injection 10 mL  10 mL Intradermal Once Festus Aloe, MD      . lisinopril (ZESTRIL) tablet 10 mg  10 mg Oral Daily Festus Aloe, MD   Stopped at 08/26/18 [phone]  . MEDLINE mouth rinse  15 mL Mouth Rinse BID Festus Aloe, MD   15 mL at 08/24/18 2223  . morphine 4 MG/ML injection 4 mg  4 mg Intravenous Q4H PRN Festus Aloe, MD      .  ondansetron (ZOFRAN) tablet 4 mg  4 mg Oral Q6H PRN Jerilee FieldEskridge, Matthew, MD       Or  . ondansetron [facility](ZOFRAN) injection 4 mg  4 mg Intravenous Q6H PRN Jerilee FieldEskridge, Matthew, MD   4 mg at 08/25/18 1744  . oxyCODONE-acetaminophen (PERCOCET/ROXICET) 5-325 MG per tablet 1-2 tablet  1-2 tablet Oral Q4H PRN Jerilee FieldEskridge, Matthew, MD   2 tablet at 08/26/18 0910  . piperacillin-tazobactam (ZOSYN) IVPB 3.375 g  3.375 g Intravenous Varney BilesQ8H Eskridge, Matthew, MD 12.5 mL/hr at 08/26/18 0545 3.375 g at 08/26/18 0545  . polyethylene glycol (MIRALAX / GLYCOLAX) packet 17 g  17 g Oral Daily PRN Jerilee FieldEskridge, Matthew, MD      . tamsulosin (FLOMAX) capsule 0.4 mg  0.4 mg Oral QPC supper Tukov-Yual, Magdalene S, NP      . vancomycin (VANCOCIN) 2,000 mg in sodium chloride 0.9 % 500 mL IVPB  2,000 mg  Intravenous Q24H Jerilee FieldEskridge, Matthew, MD   Stopped at 08/24/18 2226     Objective: Vital signs in last 24 hours: Temp:  [98.5 F (36.9 C)-100.4 F (38 C)] 98.6 F (37 C) (06/08 0800) Pulse Rate:  [77-148] 86 (06/08 0900) Resp:  [17-36] 26 (06/08 0900) BP: (83-184)/(65-119) 102/71 (06/08 0800) SpO2:  [79 %-99 %] 93 % (06/08 0900)  Intake/Output from previous day: 06/07 0701 - 06/08 0700 In: 2686.4 [I.V.:1254.6; IV Piggyback:1431.9] Out: 1755 [Urine:1750; Blood:5] Intake/Output this shift: No intake/output data recorded.   Physical Exam  Alert, oriented, room air Abdomen obese Right subinguinal incision with ABD pad overlying, packing in place which appears to be fresh without any active bleeding.  Wound edges appear healthy.  Approximate wound size 4 cm x 2 cm.  Lab Results:  Recent Labs    08/25/18 0634 08/26/18 0539  WBC 7.4 8.5  HGB 13.2 12.8*  HCT 40.1 39.3  PLT 217 240   BMET Recent Labs    08/25/18 0634 08/26/18 0539  NA 136 137  K 4.2 4.7  CL 103 99  CO2 26 28  GLUCOSE 305* 226*  BUN 13 16  CREATININE 1.64* 2.05*  CALCIUM 8.6* 8.7*   PT/INR No results for input(s): LABPROT, INR in the last 72 hours. ABG No results for input(s): PHART, HCO3 in the last 72 hours.  Invalid input(s): PCO2, PO2  Studies/Results: Ct Pelvis Wo Contrast  Result Date: 08/25/2018 CLINICAL DATA:  Right inguinal cellulitis/inflammation EXAM: CT PELVIS WITHOUT CONTRAST TECHNIQUE: Multidetector CT imaging of the pelvis was performed following the standard protocol without intravenous contrast. COMPARISON:  August 21, 2018 FINDINGS: Urinary Tract: The urinary bladder wall thickness is normal. No ureteral abnormality in the regions which are visualized. Bowel:  No evident bowel obstruction.  No bowel wall thickening. Vascular/Lymphatic: There are scattered foci arterial vascular calcification. No distal abdominal aortic or pelvic arterial aneurysm. Reproductive: Prostate and seminal  vesicles appear normal in size and contour. There is vas deferens calcification bilaterally. There is fluid density attenuation throughout the scrotal regions tracking into the upper perineum on each side, likely sizable hydroceles. Other: There is increased soft tissue stranding in the right inferior inguinal region tracking toward the scrotum. There is more ill-defined fluid in this area which is increased in size compared to the previous study, measuring 7.6 x 3.4 cm. Soft tissue stranding extends to the level of the upper scrotum, although fluid does not extend to this distance. There are nearby mildly prominent lymph nodes which likely are of reactive/inflammatory etiology. No similar changes are noted elsewhere in  the pelvis. Visualized portions of the appendix appear normal. Within the peritoneum and retroperitoneum of the pelvis, there is no abscess or ascites. Musculoskeletal: No blastic or lytic bone lesions evident. No fracture or dislocation. No intramuscular lesions are demonstrable. IMPRESSION: 1. Apparent phlegmon tracking in the medial inguinal region with surrounding soft tissue stranding and cellulitis. Fluid in this area has increased in volume and become more irregular in contour. There is no frank air in this collection. Suspect developing phlegmon in this area. Fluid tracks to the anterior most aspect of the perineum. Nearby lymph nodes are probably of inflammatory etiology. 2. Sizable apparent scrotal hydroceles bilaterally with fluid attenuation in these apparent hydroceles. Fluid tracks into the inferior perineum on both sides. 3. There is no ascites or abscess within the peritoneum or retroperitoneum of the pelvis. Visualized appendix appears normal. No bony destruction or erosion. These results will be called to the ordering clinician or representative by the Radiologist Assistant, and communication documented in the PACS or zVision Dashboard. Electronically Signed   By: Bretta BangWilliam  Woodruff  III M.D.   On: 08/25/2018 10:36   Dg Chest Port 1 View  Result Date: 08/25/2018 CLINICAL DATA:  Possible pulmonary edema, status post I and D EXAM: PORTABLE CHEST 1 VIEW COMPARISON:  07/12/2016 FINDINGS: Very low volume AP portable examination with probable small layering pleural effusions. There is bronchovascular crowding without overt pulmonary edema. There is no focal airspace opacity. Consider PA and lateral radiographs to further evaluate. IMPRESSION: Very low volume AP portable examination with probable small layering pleural effusions. There is bronchovascular crowding without overt pulmonary edema. There is no focal airspace opacity. Consider PA and lateral radiographs to further evaluate. Electronically Signed   By: Lauralyn PrimesAlex  Bibbey M.D.   On: 08/25/2018 19:21     Assessment: s/p Procedure(s): INCISION AND DRAINAGE ABSCESS  Plan: -packing to be changed daily, discussed wound care dispo -Follow-up intraoperative wound culture, Gram stain with rare gram-positive cocci -Okay to DC Foley -Agree with transfer to floor, clinically stable without any ongoing respiratory issues    LOS: 5 days    Micheal Robinson 08/26/2018

## 2018-08-27 LAB — GLUCOSE, CAPILLARY: Glucose-Capillary: 256 mg/dL — ABNORMAL HIGH (ref 70–99)

## 2018-08-27 MED ORDER — CLINDAMYCIN HCL 150 MG PO CAPS: 600.0000 mg | ORAL_CAPSULE | Freq: Three times a day (TID) | ORAL | Status: DC

## 2018-08-27 MED ORDER — SULFAMETHOXAZOLE-TRIMETHOPRIM 800-160 MG PO TABS
2.0000 | ORAL_TABLET | Freq: Two times a day (BID) | ORAL | Status: DC
  Filled 2018-08-27: qty 2

## 2018-08-27 MED ORDER — CEPHALEXIN 500 MG PO CAPS: 500.0000 mg | ORAL_CAPSULE | Freq: Four times a day (QID) | ORAL | Status: DC

## 2018-08-27 NOTE — Progress Notes (Signed)
Responded to PIV consult after pt agreed to attempt for access. Attempted x2. Pt continued changing positions during attempt; pt moving on and off of bed. Pt stated he wants to go home. RN notified.

## 2018-08-27 NOTE — Addendum Note (Signed)
Addendum  created 08/27/18 0850 by Piscitello, Precious Haws, MD   Delete clinical note

## 2018-08-27 NOTE — Anesthesia Postprocedure Evaluation (Deleted)
Anesthesia Post Note  Patient: Micheal Robinson.  Procedure(s) Performed: INCISION AND DRAINAGE ABSCESS (Right )  Patient location during evaluation: PACU Anesthesia Type: General Level of consciousness: awake and alert Pain management: pain level controlled Vital Signs Assessment: post-procedure vital signs reviewed and stable Respiratory status: spontaneous breathing, nonlabored ventilation, respiratory function stable and patient connected to nasal cannula oxygen Cardiovascular status: blood pressure returned to baseline and stable Postop Assessment: no apparent nausea or vomiting Anesthetic complications: no     Last Vitals:  Vitals:   08/26/18 2212 08/27/18 0437  BP:  130/86  Pulse: 85 96  Resp: 16 18  Temp:  36.6 C  SpO2: 93% 94%    Last Pain:  Vitals:   08/27/18 0437  TempSrc: Oral  PainSc:                  Micheal Robinson

## 2018-08-27 NOTE — Progress Notes (Signed)
Patient refusing IV sticks and AM labs; found patient on 1st level, trying to leave; Asked patient why he was leaving and he stated that he was tired of everything and tired of needles; strongly encouraged patient to at least let this RN change his dressing and that he really NEEDED the antibiotics; "...No, my wife can do the dressing changes..."  Patient signed the [facility] papers and was escorted to private vehicle and wife; told wife that he really needed to stay and get the antibiotics; patient and wife stated that the antibiotics can be called in to either [facility] or [facility] on [address] [place], [place] 6:23 AM 08/27/2018

## 2018-08-27 NOTE — Progress Notes (Signed)
Dr. Sidney Ace notified of patient leaving AMA; acknowledged. Barbaraann Faster, RN 6:29 AM 08/27/2018

## 2018-08-27 NOTE — Progress Notes (Addendum)
Responded to PIV consult. On arrival to room, pt refused attempt. Educated pt needs PIV for future antibiotic dose. Pt states he has "been stuck too many times" and is "willing to take a pill" but does not want "any more sticks right now". RN in another room. CN made aware and states will notify RN.

## 2018-08-27 NOTE — Progress Notes (Signed)
Dr. Sidney Ace notified of patient's refusal of IV reinsertion; Acknowledged; encouraged to tell patient that he needs the IV antibiotics; new orders written, "...until patient agrees with IV reinsertion; also notify pharmacy".  Barbaraann Faster, RN 2:51 AM 08/27/2018

## 2018-08-27 NOTE — Progress Notes (Signed)
Dr. Erlene Quan courtesy called to notify of patient leaving AMA. Acknowledged. Barbaraann Faster, RN; 6:34 AM 08/27/2018

## 2018-08-30 NOTE — Discharge Summary (Signed)
Patient left AMA on 6/9. He was POD #1 after and I&D for a right inguinal abscess. We were unable to prescribe him any antibiotics, get home health, or follow-up appointments set up prior to discharge.  Hyman Bible, MD

## 2018-08-31 LAB — AEROBIC/ANAEROBIC CULTURE W GRAM STAIN (SURGICAL/DEEP WOUND): Gram Stain: NONE SEEN

## 2019-12-15 IMAGING — DX PORTABLE CHEST - 1 VIEW
1 series · 1 of 1 positions shown · non-contrast
Comparison: 07/12/2016

CLINICAL DATA: Possible pulmonary edema, status post I and D

EXAM:
PORTABLE CHEST 1 VIEW

[chest ap]
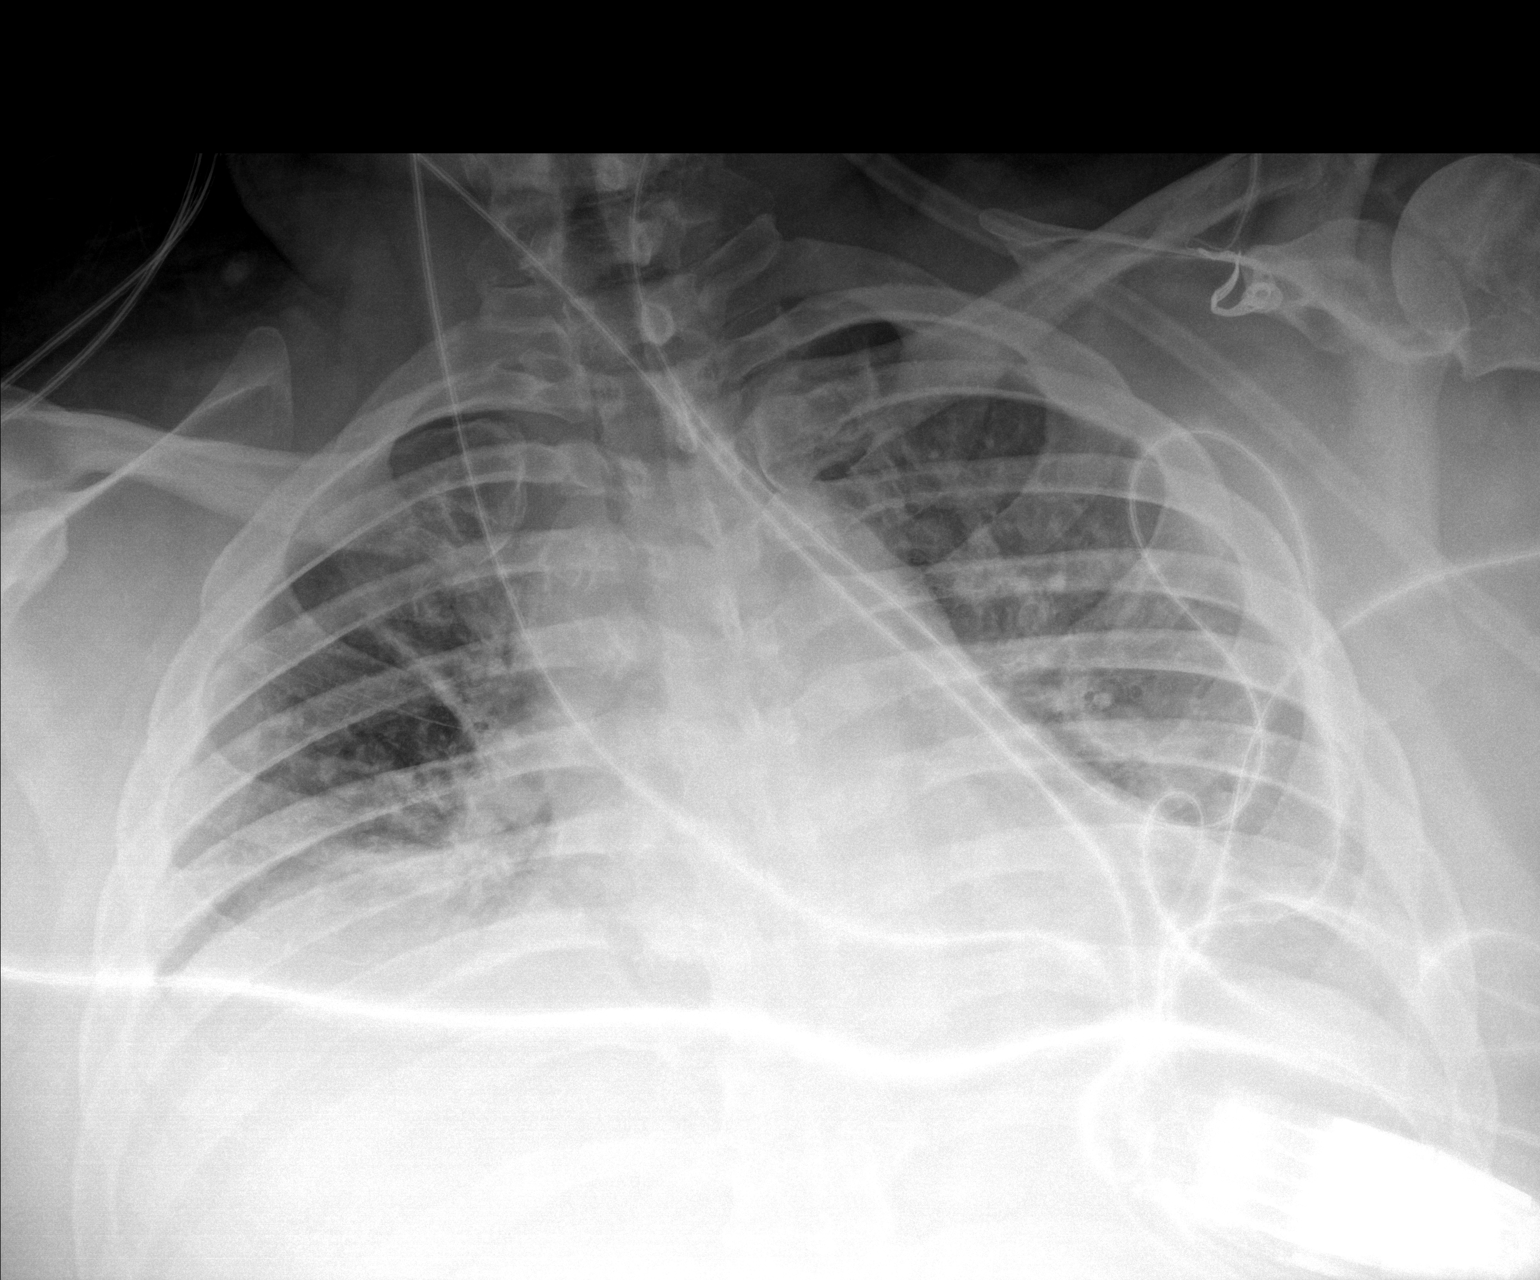

[1 of 1 positions shown; findings below may reference images not displayed]

FINDINGS: Very low volume AP portable examination with probable small layering
pleural effusions. There is bronchovascular crowding without overt
pulmonary edema. There is no focal airspace opacity. Consider PA and
lateral radiographs to further evaluate.
IMPRESSION: Very low volume AP portable examination with probable small layering
pleural effusions. There is bronchovascular crowding without overt
pulmonary edema. There is no focal airspace opacity. Consider PA and
lateral radiographs to further evaluate.
